# Patient Record
Sex: Female | Born: 1955 | Race: Black or African American | Hispanic: No | Smoking: Former smoker
Health system: Southern US, Community
[De-identification: ages and names within clinical notes are randomized; demographics above are authoritative.]

## PROBLEM LIST (undated history)

## (undated) DIAGNOSIS — K219 Gastro-esophageal reflux disease without esophagitis: Secondary | ICD-10-CM

## (undated) DIAGNOSIS — F329 Major depressive disorder, single episode, unspecified: Secondary | ICD-10-CM

## (undated) DIAGNOSIS — G4730 Sleep apnea, unspecified: Secondary | ICD-10-CM

## (undated) DIAGNOSIS — E039 Hypothyroidism, unspecified: Secondary | ICD-10-CM

## (undated) DIAGNOSIS — F419 Anxiety disorder, unspecified: Secondary | ICD-10-CM

## (undated) DIAGNOSIS — E785 Hyperlipidemia, unspecified: Secondary | ICD-10-CM

## (undated) DIAGNOSIS — E079 Disorder of thyroid, unspecified: Secondary | ICD-10-CM

## (undated) DIAGNOSIS — F32A Depression, unspecified: Secondary | ICD-10-CM

## (undated) DIAGNOSIS — E119 Type 2 diabetes mellitus without complications: Secondary | ICD-10-CM

## (undated) DIAGNOSIS — I10 Essential (primary) hypertension: Secondary | ICD-10-CM

## (undated) HISTORY — DX: Essential (primary) hypertension: I10

## (undated) HISTORY — DX: Anxiety disorder, unspecified: F41.9

## (undated) HISTORY — DX: Type 2 diabetes mellitus without complications: E11.9

## (undated) HISTORY — DX: Depression, unspecified: F32.A

## (undated) HISTORY — PX: THYROIDECTOMY: SHX17

## (undated) HISTORY — DX: Disorder of thyroid, unspecified: E07.9

## (undated) HISTORY — DX: Hypothyroidism, unspecified: E03.9

## (undated) HISTORY — PX: CATARACT EXTRACTION, BILATERAL: SHX1313

## (undated) HISTORY — DX: Gastro-esophageal reflux disease without esophagitis: K21.9

## (undated) HISTORY — PX: JOINT REPLACEMENT: SHX530

## (undated) HISTORY — DX: Sleep apnea, unspecified: G47.30

## (undated) HISTORY — DX: Hyperlipidemia, unspecified: E78.5

## (undated) HISTORY — DX: Major depressive disorder, single episode, unspecified: F32.9

---

## 1994-10-25 DIAGNOSIS — E119 Type 2 diabetes mellitus without complications: Secondary | ICD-10-CM

## 1994-10-25 HISTORY — DX: Type 2 diabetes mellitus without complications: E11.9

## 2004-10-25 HISTORY — PX: ABDOMINAL HYSTERECTOMY: SHX81

## 2007-10-26 HISTORY — PX: ABDOMINAL HYSTERECTOMY: SHX81

## 2015-10-26 HISTORY — PX: TOTAL KNEE ARTHROPLASTY: SHX125

## 2016-08-02 ENCOUNTER — Encounter: Payer: Self-pay | Admitting: Internal Medicine

## 2016-08-02 ENCOUNTER — Ambulatory Visit (INDEPENDENT_AMBULATORY_CARE_PROVIDER_SITE_OTHER): Payer: Self-pay | Admitting: Internal Medicine

## 2016-08-02 VITALS — BP 166/77 | Ht 66.0 in | Wt 237.2 lb

## 2016-08-02 DIAGNOSIS — E739 Lactose intolerance, unspecified: Secondary | ICD-10-CM

## 2016-08-02 DIAGNOSIS — Z79899 Other long term (current) drug therapy: Secondary | ICD-10-CM

## 2016-08-02 DIAGNOSIS — I10 Essential (primary) hypertension: Secondary | ICD-10-CM | POA: Insufficient documentation

## 2016-08-02 DIAGNOSIS — F329 Major depressive disorder, single episode, unspecified: Secondary | ICD-10-CM

## 2016-08-02 DIAGNOSIS — Z9089 Acquired absence of other organs: Secondary | ICD-10-CM

## 2016-08-02 DIAGNOSIS — Z87891 Personal history of nicotine dependence: Secondary | ICD-10-CM

## 2016-08-02 DIAGNOSIS — Z96651 Presence of right artificial knee joint: Secondary | ICD-10-CM

## 2016-08-02 DIAGNOSIS — Z888 Allergy status to other drugs, medicaments and biological substances status: Secondary | ICD-10-CM

## 2016-08-02 DIAGNOSIS — M170 Bilateral primary osteoarthritis of knee: Secondary | ICD-10-CM

## 2016-08-02 DIAGNOSIS — Z800 Family history of malignant neoplasm of digestive organs: Secondary | ICD-10-CM

## 2016-08-02 DIAGNOSIS — Z7984 Long term (current) use of oral hypoglycemic drugs: Secondary | ICD-10-CM

## 2016-08-02 DIAGNOSIS — Z90711 Acquired absence of uterus with remaining cervical stump: Secondary | ICD-10-CM

## 2016-08-02 DIAGNOSIS — F32A Depression, unspecified: Secondary | ICD-10-CM | POA: Insufficient documentation

## 2016-08-02 DIAGNOSIS — E785 Hyperlipidemia, unspecified: Secondary | ICD-10-CM

## 2016-08-02 DIAGNOSIS — Z0000 Encounter for general adult medical examination without abnormal findings: Secondary | ICD-10-CM | POA: Insufficient documentation

## 2016-08-02 DIAGNOSIS — Z803 Family history of malignant neoplasm of breast: Secondary | ICD-10-CM

## 2016-08-02 DIAGNOSIS — E890 Postprocedural hypothyroidism: Secondary | ICD-10-CM

## 2016-08-02 DIAGNOSIS — K279 Peptic ulcer, site unspecified, unspecified as acute or chronic, without hemorrhage or perforation: Secondary | ICD-10-CM

## 2016-08-02 DIAGNOSIS — E119 Type 2 diabetes mellitus without complications: Secondary | ICD-10-CM

## 2016-08-02 DIAGNOSIS — Z8711 Personal history of peptic ulcer disease: Secondary | ICD-10-CM

## 2016-08-02 DIAGNOSIS — Z23 Encounter for immunization: Secondary | ICD-10-CM

## 2016-08-02 DIAGNOSIS — M1712 Unilateral primary osteoarthritis, left knee: Secondary | ICD-10-CM

## 2016-08-02 DIAGNOSIS — Z8349 Family history of other endocrine, nutritional and metabolic diseases: Secondary | ICD-10-CM

## 2016-08-02 LAB — GLUCOSE, CAPILLARY: Glucose-Capillary: 78 mg/dL (ref 65–99)

## 2016-08-02 LAB — POCT GLYCOSYLATED HEMOGLOBIN (HGB A1C): HEMOGLOBIN A1C: 6.1

## 2016-08-02 MED ORDER — LEVOTHYROXINE SODIUM 125 MCG PO TABS: 125.0000 ug | ORAL_TABLET | Freq: Every day | ORAL | 2 refills | Status: DC

## 2016-08-02 MED ORDER — LOSARTAN POTASSIUM 100 MG PO TABS: 100.0000 mg | ORAL_TABLET | Freq: Every day | ORAL | 2 refills | Status: DC

## 2016-08-02 MED ORDER — OMEPRAZOLE 20 MG PO CPDR: 20.0000 mg | DELAYED_RELEASE_CAPSULE | Freq: Every day | ORAL | 2 refills | Status: DC

## 2016-08-02 MED ORDER — AMLODIPINE BESYLATE 10 MG PO TABS: 10.0000 mg | ORAL_TABLET | Freq: Every day | ORAL | 2 refills | Status: DC

## 2016-08-02 MED ORDER — HYDROCHLOROTHIAZIDE 25 MG PO TABS: 25.0000 mg | ORAL_TABLET | Freq: Every day | ORAL | 2 refills | Status: DC

## 2016-08-02 MED ORDER — ATENOLOL 50 MG PO TABS: 50.0000 mg | ORAL_TABLET | Freq: Every day | ORAL | 2 refills | Status: DC

## 2016-08-02 NOTE — Patient Instructions (Addendum)
It was a pleasure to meet you [name].  I will send a refill of your blood pressure, thyroid, and heartburn/stomach medication.  We are giving you the flu shot today.  Please call us if you have any concerns and follow up with us in 3 months or sooner if needed.   DASH Eating Plan DASH stands for "Dietary Approaches to Stop Hypertension." The DASH eating plan is a healthy eating plan that has been shown to reduce high blood pressure (hypertension). Additional health benefits may include reducing the risk of type 2 diabetes mellitus, heart disease, and stroke. The DASH eating plan may also help with weight loss. WHAT DO I NEED TO KNOW ABOUT THE DASH EATING PLAN? For the DASH eating plan, you will follow these general guidelines:  Choose foods with a percent daily value for sodium of less than 5% (as listed on the food label).  Use salt-free seasonings or herbs instead of table salt or sea salt.  Check with your health care provider or pharmacist before using salt substitutes.  Eat lower-sodium products, often labeled as "lower sodium" or "no salt added."  Eat fresh foods.  Eat more vegetables, fruits, and low-fat dairy products.  Choose whole grains. Look for the word "whole" as the first word in the ingredient list.  Choose fish and skinless chicken or Malawiturkey more often than red meat. Limit fish, poultry, and meat to 6 oz (170 g) each day.  Limit sweets, desserts, sugars, and sugary drinks.  Choose heart-healthy fats.  Limit cheese to 1 oz (28 g) per day.  Eat more home-cooked food and less restaurant, buffet, and fast food.  Limit fried foods.  Cook foods using methods other than frying.  Limit canned vegetables. If you do use them, rinse them well to decrease the sodium.  When eating at a restaurant, ask that your food be prepared with less salt, or no salt if possible. WHAT FOODS CAN I EAT? Seek help from a dietitian for individual calorie needs. Grains Whole  grain or whole wheat bread. Brown rice. Whole grain or whole wheat pasta. Quinoa, bulgur, and whole grain cereals. Low-sodium cereals. Corn or whole wheat flour tortillas. Whole grain cornbread. Whole grain crackers. Low-sodium crackers. Vegetables Fresh or frozen vegetables (raw, steamed, roasted, or grilled). Low-sodium or reduced-sodium tomato and vegetable juices. Low-sodium or reduced-sodium tomato sauce and paste. Low-sodium or reduced-sodium canned vegetables.  Fruits All fresh, canned (in natural juice), or frozen fruits. Meat and Other Protein Products Ground beef (85% or leaner), grass-fed beef, or beef trimmed of fat. Skinless chicken or Malaw[place]urkey. Ground chicken or [place]urkey. Pork trimmed of fat. All fish and seafood. Eggs. Dried beans, peas, or lentils. Unsalted nuts and seeds. Unsalted canned beans. Dairy Low-fat dairy products, such as skim or 1% milk, 2% or reduced-fat cheeses, low-fat ricotta or cottage cheese, or plain low-fat yogurt. Low-sodium or reduced-sodium cheeses. Fats and Oils Tub margarines without trans fats. Light or reduced-fat mayonnaise and salad dressings (reduced sodium). Avocado. Safflower, olive, or canola oils. Natural peanut or almond butter. Other Unsalted popcorn and pretzels. The items listed above may not be a complete list of recommended foods or beverages. Contact your dietitian for more options. WHAT FOODS ARE NOT RECOMMENDED? Grains White bread. White pasta. White rice. Refined cornbread. Bagels and croissants. Crackers that contain trans fat. Vegetables Creamed or fried vegetables. Vegetables in a cheese sauce. Regular canned vegetables. Regular canned tomato sauce and paste. Regular tomato and vegetable juices. Fruits Dried fruits. Canned fruit  in light or heavy syrup. Fruit juice. Meat and Other Protein Products Fatty cuts of meat. Ribs, chicken wings, bacon, sausage, bologna, salami, chitterlings, fatback, hot dogs, bratwurst, and packaged luncheon  meats. Salted nuts and seeds. Canned beans with salt. Dairy Whole or 2% milk, cream, half-and-half, and cream cheese. Whole-fat or sweetened yogurt. Full-fat cheeses or blue cheese. Nondairy creamers and whipped toppings. Processed cheese, cheese spreads, or cheese curds. Condiments Onion and garlic salt, seasoned salt, table salt, and sea salt. Canned and packaged gravies. Worcestershire sauce. Tartar sauce. Barbecue sauce. Teriyaki sauce. Soy sauce, including reduced sodium. Steak sauce. Fish sauce. Oyster sauce. Cocktail sauce. Horseradish. Ketchup and mustard. Meat flavorings and tenderizers. Bouillon cubes. Hot sauce. Tabasco sauce. Marinades. Taco seasonings. Relishes. Fats and Oils Butter, stick margarine, lard, shortening, ghee, and bacon fat. Coconut, palm kernel, or palm oils. Regular salad dressings. Other Pickles and olives. Salted popcorn and pretzels. The items listed above may not be a complete list of foods and beverages to avoid. Contact your dietitian for more information. WHERE CAN I FIND MORE INFORMATION? National Heart, Lung, and Blood Institute: travelstabloid.com   This information is not intended to replace advice given to you by your health care provider. Make sure you discuss any questions you have with your health care provider.   Document Released: 09/30/2011 Document Revised: 11/01/2014 Document Reviewed: 08/15/2013 Elsevier Interactive Patient Education [employer].

## 2016-08-02 NOTE — Assessment & Plan Note (Signed)
Patient reports a history of peptic ulcer disease. She brought photographs from a prior EGD which did show PUD. She takes Omeprazole 20 mg daily with relief. No current heartburn or reflux symptoms. She avoids NSAIDs. -Refilled Omeprazole 20 mg daily

## 2016-08-02 NOTE — Assessment & Plan Note (Signed)
Patient takes Fluoxetine 60 mg daily with good control of her depression. She denies any current SI or HI. No hallucinations. 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 -Continue fluoxetine 60 mg daily

## 2016-08-02 NOTE — Assessment & Plan Note (Addendum)
Patient currently takes HCTZ 25 mg daily, Losartan 100 mg daily, Atenolol 50 mg daily, and Amlodipine 10 mg daily. Patient's BP today is 166/77. She does state that she ran out of her Amlodipine a few days ago. She is requesting a refill of all the above medications.  BP is elevated today in the setting of missed amlodipine doses. Will continue current medications and monitor on follow up. -Continue current medications -Have sent refills for above medications -If BPs persistently elevated, can consider increasing HCTZ or Atenolol -DASH diet information provided -Continue diet/exercise -Check CMET

## 2016-08-02 NOTE — Assessment & Plan Note (Addendum)
Flu shot given today. Patient brought records indicating her last colonoscopy was in 2015 which showed a sigmoid diverticulosis with recommendation to repeat in 10 years. Last mammogram was in 2016. -Next Colonoscopy in 2025 -Address repeat mammogram on follow up

## 2016-08-02 NOTE — Assessment & Plan Note (Signed)
Reported history of OA in both knees with right knee replacement in April 2017. She uses a can to ambulate long distances, otherwise is able to get around her house to complete her ADLs without issue. No recent falls. Her pain is managed with Tylenol 1200 mg BID. She does not take NSAIDs given her history of peptic ulcer disease.  OA is well controlled with Tylenol. She is working on diet and exercise to try to reduce her weight. -Continue diet/exercise -Tylenol as needed, recommended limiting to 3000 mg daily

## 2016-08-02 NOTE — Progress Notes (Signed)
CC: HTN  HPI:  [name] is a 60 y.o. 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  Patient moved to [place] from [place] in June 2017, previously lived in [place]. She currently lives with her sister. She has brought her medication bottles with her today.  HTN: Patient currently takes HCTZ 25 mg daily, Losartan 100 mg daily, Atenolol 50 mg daily, and Amlodipine 10 mg daily. Patient's BP today is 166/77. She does state that she ran out of her Amlodipine a few days ago. She is requesting a refill of all the above medications.  T2DM: Diagnosed with diabetes in her 7340s. She states she has never taken insulin but has been on an injectable medication for her diabetes in the past. She currently takes Metformin ER 1000 mg BID. She does report some loose stool which she feels is associated with her Metformin. She does report a history of lactose intolerance and has avoided dairy products. She did bring her meter today. She checks her CBGs 1-2 times per day. She has one high value of 241, majority of values are between 90-120 with an average of 107. She denies any symptoms of highs or lows.  HLD: Currently takes moderate intensity Atorvastatin 20 mg daily.   Hypothyroidism: Patient reports a history of a goiter with subsequent total thyroidectomy. She is taking Levothyroxine 125 mcg daily.  PUD: Patient reports a history of peptic ulcer disease. She brought photographs from a prior EGD which did show PUD. She takes Omeprazole 20 mg daily with relief. No current heartburn or reflux symptoms. She avoids NSAIDs.   Osteoarthritis: Reported history of OA in both knees with right knee replacement in April 2017. She uses a can to ambulate long distances, otherwise is able to get around  her house to complete her ADLs without issue. No recent falls. Her pain is managed with Tylenol 1200 mg BID. She does not take NSAIDs given her history of peptic ulcer disease.  Depression: Patient takes Fluoxetine 60 mg daily with good control of her depression. She denies any current SI or HI. No hallucinations. 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  Health maintenance: Patient agreeable for flu shot today.  Past Surgical History: Thyroidectomy Right knee replacement April 2014 Partial hysterectomy 2009  Family Hx: Father - Colon cancer, goiter Mother - goiter Sister - triple negative breast cancer Maternal aunts - breast cancer  Allergies: She reports tongue swelling with carvedilol      Past Medical History:  Diagnosis Date  . Anxiety   . Depression   . Diabetes mellitus without complication (HCC)   . GERD (gastroesophageal reflux disease)   . Hyperlipidemia   . Hypertension   . Thyroid disease     Review of Systems:   Review of Systems  Constitutional: Positive for diaphoresis. Negative for chills and fever.  Eyes: Positive for double vision.  Respiratory: Negative for cough, hemoptysis, sputum production, shortness of breath and wheezing.   Cardiovascular: Negative for chest pain, palpitations and leg swelling.  Gastrointestinal: Positive for diarrhea. Negative for abdominal pain, blood in stool, constipation, heartburn, melena, nausea and vomiting.  Genitourinary: Negative for dysuria, frequency, hematuria and urgency.  Musculoskeletal: Positive for joint pain and neck pain. Negative for falls.  Neurological: Negative for  dizziness, tingling, seizures, loss of consciousness and headaches.  Psychiatric/Behavioral: Positive for depression. Negative for hallucinations, substance abuse and suicidal ideas.     Physical Exam:  Vitals:   08/02/16 0951  BP: (!)  166/77  SpO2: 99%  Weight: 237 lb 3.2 oz (107.6 kg)  Height: 5\' 6"  (1.676 m)   Physical Exam  Constitutional: She is oriented to person, place, and time. She appears well-developed and well-nourished. No distress.  HENT:  Head: Normocephalic and atraumatic.  Eyes: EOM are normal.  Neck: Normal range of motion. Neck supple. No tracheal deviation present.  Cardiovascular: Normal rate and regular rhythm.  Exam reveals no gallop and no friction rub.   No murmur heard. +2 DP pulses  Pulmonary/Chest: Effort normal. No respiratory distress. She has no wheezes. She has no rales.  Abdominal: Soft. Bowel sounds are normal. She exhibits no distension and no mass. There is no tenderness. There is no rebound and no guarding.  Musculoskeletal: She exhibits no edema or tenderness.  Ambulating with cane  Neurological: She is alert and oriented to person, place, and time.  Skin: Skin is warm. No rash noted. She is not diaphoretic. No erythema.  Psychiatric: She has a normal mood and affect.     Assessment & Plan:   See Encounters Tab for problem based charting.  Patient discussed with Dr. Heide Spark

## 2016-08-02 NOTE — Assessment & Plan Note (Addendum)
Diagnosed with diabetes in her 5940s. She states she has never taken insulin but has been on an injectable medication for her diabetes in the past. She currently takes Metformin ER 1000 mg BID. She does report some loose stool which she feels is associated with her Metformin. She does report a history of lactose intolerance and has avoided dairy products. She did bring her meter today. She checks her CBGs 1-2 times per day. She has one high value of 241, majority of values are between 90-120 with an average of 107. She denies any symptoms of highs or lows.  Hgb A1c today is 6.1, well controlled. If patient has continued loose stools, can consider decreasing Metformin to see if a reduced dose provides relief. -Continue Metformin 1000 mg BID -f/u 3 months for repeat Hgb a1c -Continue diet and exercise -Check CMET

## 2016-08-02 NOTE — Assessment & Plan Note (Addendum)
Patient reports a history of a goiter with subsequent total thyroidectomy. She is taking Levothyroxine 125 mcg daily. -Check TSH -Refilled Levothyroxine 125 mcg daily, adjust if TSH elevated

## 2016-08-02 NOTE — Assessment & Plan Note (Signed)
Currently takes moderate intensity Atorvastatin 20 mg daily.  Patient with HTN and T2DM. Will check Lipid Panel to calculate ASCVD score and consider increasing to high-intensity statin. -Lipid panel

## 2016-08-03 ENCOUNTER — Telehealth: Payer: Self-pay | Admitting: Internal Medicine

## 2016-08-03 LAB — LIPID PANEL
CHOLESTEROL TOTAL: 137 mg/dL (ref 100–199)
Chol/HDL Ratio: 2.4 ratio units (ref 0.0–4.4)
HDL: 58 mg/dL (ref >39)
LDL Calculated: 66 mg/dL (ref 0–99)
TRIGLYCERIDES: 67 mg/dL (ref 0–149)
VLDL Cholesterol Cal: 13 mg/dL (ref 5–40)

## 2016-08-03 LAB — CMP14 + ANION GAP
A/G RATIO: 1.5 (ref 1.2–2.2)
ALBUMIN: 4.3 g/dL (ref 3.6–4.8)
ALT: 11 IU/L (ref 0–32)
ANION GAP: 16 mmol/L (ref 10.0–18.0)
AST: 13 IU/L (ref 0–40)
Alkaline Phosphatase: 117 IU/L (ref 39–117)
BUN/Creatinine Ratio: 19 (ref 12–28)
BUN: 14 mg/dL (ref 8–27)
Bilirubin Total: 0.4 mg/dL (ref 0.0–1.2)
CALCIUM: 9.7 mg/dL (ref 8.7–10.3)
CO2: 27 mmol/L (ref 18–29)
CREATININE: 0.72 mg/dL (ref 0.57–1.00)
Chloride: 101 mmol/L (ref 96–106)
GFR, EST AFRICAN AMERICAN: 105 mL/min/{1.73_m2} (ref >59)
GFR, EST NON AFRICAN AMERICAN: 91 mL/min/{1.73_m2} (ref >59)
GLOBULIN, TOTAL: 2.8 g/dL (ref 1.5–4.5)
Glucose: 83 mg/dL (ref 65–99)
POTASSIUM: 4.6 mmol/L (ref 3.5–5.2)
SODIUM: 144 mmol/L (ref 134–144)
TOTAL PROTEIN: 7.1 g/dL (ref 6.0–8.5)

## 2016-08-03 LAB — TSH: TSH: 0.376 u[IU]/mL — ABNORMAL LOW (ref 0.450–4.500)

## 2016-08-03 NOTE — Telephone Encounter (Signed)
I am checking with [place]. To see if this person is [facility] pt

## 2016-08-03 NOTE — Telephone Encounter (Signed)
Pt states that losartan (COZAAR) 100 MG tablet cost $86 and she can not affod that would like a different medicaiton

## 2016-08-04 MED ORDER — LISINOPRIL 40 MG PO TABS: 40.0000 mg | ORAL_TABLET | Freq: Every day | ORAL | 2 refills | Status: DC

## 2016-08-04 NOTE — Telephone Encounter (Signed)
I will change the Losartan to Lisinopril 40 mg daily which is on the [facility] preferred list. Please let me know if she is unable to take this for any reason (cost, side effect). Thank you.

## 2016-08-04 NOTE — Telephone Encounter (Signed)
Agree with Dr patel. If unable to take ACEi she may be able to get it for 4 dollars from the [facility] pharmacy.

## 2016-08-04 NOTE — Progress Notes (Signed)
Internal Medicine Clinic Attending  Case discussed with Dr. Patel,Vishal at the time of the visit.  We reviewed the resident's history and exam and pertinent patient test results.  I agree with the assessment, diagnosis, and plan of care documented in the resident's note.  

## 2016-08-05 NOTE — Telephone Encounter (Signed)
Spoke w/ pt encouraged her to finish paperwork w/ debh. So she can come in for f/u

## 2016-08-06 NOTE — Telephone Encounter (Signed)
Thank you! If needed, feel free to schedule her with me for a med visit.

## 2016-09-30 ENCOUNTER — Other Ambulatory Visit: Payer: Self-pay | Admitting: *Deleted

## 2016-09-30 DIAGNOSIS — E890 Postprocedural hypothyroidism: Secondary | ICD-10-CM

## 2016-09-30 MED ORDER — LEVOTHYROXINE SODIUM 125 MCG PO TABS: 125.0000 ug | ORAL_TABLET | Freq: Every day | ORAL | 0 refills | Status: DC

## 2016-09-30 NOTE — Telephone Encounter (Signed)
TSH slightly low. Dr Allena KatzPatel planned to change dose. Pt needs appt next 30 days.  Brand change OK

## 2016-09-30 NOTE — Telephone Encounter (Signed)
Pharmacy informed that change will be ok and appt request already sent to front office.Kingsley SpittleGoldston, Karlina Suares Cassady12/7/20173:14 PM

## 2016-09-30 NOTE — Telephone Encounter (Signed)
Received fax from pt's pharmacy with the following statement "Pt been on Sandoz brand but its on backorder til January. May we change to Lannett brand til then?"  Will forward to [facility] attending for review.  Of note, I attempted to contact pt to schedule an appt and see what her plans were regarding paperwork for the financial counselor.  Home # was disconnected and no answer on the mobile #, but I was able to leave a message.  Will await call back from pt and send info to [facility] attending as pt has not been assigned a pcp.  Please advise.Kingsley SpittleGoldston, Darra Rosa Cassady12/7/20172:15 PM

## 2016-11-02 ENCOUNTER — Ambulatory Visit (INDEPENDENT_AMBULATORY_CARE_PROVIDER_SITE_OTHER): Payer: Self-pay | Admitting: Internal Medicine

## 2016-11-02 VITALS — BP 160/97 | HR 58 | Temp 98.2°F | Ht 66.0 in | Wt 251.7 lb

## 2016-11-02 DIAGNOSIS — I10 Essential (primary) hypertension: Secondary | ICD-10-CM

## 2016-11-02 DIAGNOSIS — E785 Hyperlipidemia, unspecified: Secondary | ICD-10-CM

## 2016-11-02 DIAGNOSIS — Z7984 Long term (current) use of oral hypoglycemic drugs: Secondary | ICD-10-CM

## 2016-11-02 DIAGNOSIS — Z021 Encounter for pre-employment examination: Secondary | ICD-10-CM

## 2016-11-02 DIAGNOSIS — Z0000 Encounter for general adult medical examination without abnormal findings: Secondary | ICD-10-CM

## 2016-11-02 DIAGNOSIS — E890 Postprocedural hypothyroidism: Secondary | ICD-10-CM

## 2016-11-02 DIAGNOSIS — Z79899 Other long term (current) drug therapy: Secondary | ICD-10-CM

## 2016-11-02 DIAGNOSIS — F329 Major depressive disorder, single episode, unspecified: Secondary | ICD-10-CM

## 2016-11-02 DIAGNOSIS — Z111 Encounter for screening for respiratory tuberculosis: Secondary | ICD-10-CM

## 2016-11-02 DIAGNOSIS — Z1159 Encounter for screening for other viral diseases: Secondary | ICD-10-CM

## 2016-11-02 DIAGNOSIS — Z598 Other problems related to housing and economic circumstances: Secondary | ICD-10-CM

## 2016-11-02 DIAGNOSIS — E119 Type 2 diabetes mellitus without complications: Secondary | ICD-10-CM

## 2016-11-02 DIAGNOSIS — F32A Depression, unspecified: Secondary | ICD-10-CM

## 2016-11-02 MED ORDER — OLMESARTAN-AMLODIPINE-HCTZ 40-10-25 MG PO TABS: 1.0000 | ORAL_TABLET | Freq: Every day | ORAL | 2 refills | Status: DC

## 2016-11-02 MED ORDER — METFORMIN HCL ER (MOD) 500 MG PO TB24
500.0000 mg | ORAL_TABLET | Freq: Two times a day (BID) | ORAL | 2 refills | Status: DC
Start: 2016-11-02 — End: 2016-11-03

## 2016-11-02 MED ORDER — LEVOTHYROXINE SODIUM 125 MCG PO TABS: 125.0000 ug | ORAL_TABLET | Freq: Every day | ORAL | 0 refills | Status: DC

## 2016-11-02 NOTE — Assessment & Plan Note (Signed)
Reports unable to afford Lipitor.   Lipid panel at last visit is remarkably good.   Plan monitor for now. Consider resumption of therapy once orange card in place.

## 2016-11-02 NOTE — Progress Notes (Signed)
   CC: TB skin test  HPI:  [name] is a 61 y.o. female with a past medical history listed below here today with request for TB skin test and Hepatitis screening .  She reports she is applying for a job and is required to have TB skin testing and documentation of her Hepatitis immune status. She reports having Hep B vaccination in the remote past, unsure when. Does report a history of a positive TB skin test in the remote past as well, however, notes that she has had at least two negative tests since that time.   She recently established in our clinic back in October 2017.  Also needs follow up on her chronic illnesses today.  DM 2: A1c in 07/2016 was 6.1. She is currently only on Metformin. Listed on her medication list as 1000 mg bid but she reports only taking 1500 mg total daily. She reports GI side effects, financial difficulties and general dislike for medications as the reasons for taking few pills. Denies any episodes of hypoglycemia. Does note weight gain from 227 lbs to 251 lbs today. Reports that she is stressed about her financial situation and has not been good about her diet or exercise.  HTN: BP 160/97 today. She is currently prescribed Amlodipine 10 mg daily, HCTZ 25 mg daily, Losartan 100 mg daily and Atenolol 50 mg daily. She reports she is taking the amlodipine, HCTZ and losartan. Has not been taking the Atenolol due to costs. Denies any headache, acute vision changes, chest pain, palpations, nausea/vomiting. Reports that she does not like how the medications make her feel and that she has significant difficulty getting her medications due to costs.   Past Medical History:  Diagnosis Date  . Anxiety   . Depression   . Diabetes mellitus without complication (HCC)   . GERD (gastroesophageal reflux disease)   . Hyperlipidemia   . Hypertension   . Thyroid disease     Review of Systems:   Negative except as noted in HPI  Physical Exam:  Vitals:   11/02/16 1018   BP: (!) 160/97  Pulse: (!) 58  Temp: 98.2 F (36.8 C)  TempSrc: Oral  SpO2: 100%  Weight: 251 lb 11.2 oz (114.2 kg)  Height: 5\' 6"  (1.676 m)   Physical Exam  Constitutional: She is well-developed, well-nourished, and in no distress. No distress.  HENT:  Head: Normocephalic and atraumatic.  Cardiovascular: Normal rate, regular rhythm and normal heart sounds.   Pulmonary/Chest: Effort normal and breath sounds normal. No respiratory distress. She has no wheezes. She has no rales.  Abdominal: Soft. Bowel sounds are normal. She exhibits no distension. There is no tenderness.    Assessment & Plan:   See Encounters Tab for problem based charting.  Patient discussed with Dr. Oswaldo DoneVincent

## 2016-11-02 NOTE — Patient Instructions (Signed)
Sally Wright,  It was a pleasure meeting you today.  I have [facility] your medications [facility]. I would like you to continue taking the Synthroid 125 mcg daily but I would like you to take the Metformin 500 mg twice a day and I have started you on a combination medication called Tribenzor for your blood pressure that has the hydrochlorothiazide, amlodipine and a similar drug to losartan in it. It will be once a day.  Please set up a meeting with Chauncey Readingeb Hill to work on getting the [employer].  Come back in 2 days to have your TB test read.   I would like to see you back in a month for follow up.

## 2016-11-02 NOTE — Assessment & Plan Note (Signed)
Lab Results  Component Value Date   HGBA1C 6.1 08/02/2016     Assessment: A1c in 07/2016 was 6.1. She is currently only on Metformin. Listed on her medication list as 1000 mg bid but she reports only taking 1500 mg total daily. She reports GI side effects, financial difficulties and general dislike for medications as the reasons for taking few pills. Denies any episodes of hypoglycemia. Does note weight gain from 227 lbs to 251 lbs today. Reports that she is stressed about her financial situation and has not been good about her diet or exercise.  Plan: Will decrease metformin to 500 mg bid today Encourage dietary changes and exercise Will re-check A1c at follow up visit in 1 month

## 2016-11-02 NOTE — Assessment & Plan Note (Signed)
BP Readings from Last 3 Encounters:  11/02/16 (!) 160/97  08/02/16 (!) 166/77    Lab Results  Component Value Date   NA 144 08/02/2016   K 4.6 08/02/2016   CREATININE 0.72 08/02/2016    Assessment: BP 160/97 today. She is currently prescribed Amlodipine 10 mg daily, HCTZ 25 mg daily, Losartan 100 mg daily and Atenolol 50 mg daily. She reports she is taking the amlodipine, HCTZ and losartan. Has not been taking the Atenolol due to costs. Denies any headache, acute vision changes, chest pain, palpations, nausea/vomiting. Reports that she does not like how the medications make her feel and that she has significant difficulty getting her medications due to costs.   Denies any smoking, ETOH use. Does occasionally use caffeine.   Plan: Will d/c old prescriptions and start patient on Olmesartan-Amlodipine-HCTZ 40-10-25 mg daily combination pill. Patient with questionable compliance both reporting that she does not like taking medications as well as difficulties obtaining medications. Discussed moving [facility] Rx [facility] to the [facility] to help alleviate costs as well as the combination pill. She was agreeable.   Will have patient return in a month for follow up. If patient is taking her medications and we can demonstrate compliance will consider addition of spironolactone which is also on the 4 dollar list. Could also consider secondary causes such as OSA but patient without insurance and unable to investigate further at this time.

## 2016-11-02 NOTE — Assessment & Plan Note (Signed)
Patient reports she is no longer taking the Fluoxetine for her depression due to difficulties obtaining the medication for cost concerns. She is not interested in restarting it today. PHQ9 today is 17.  Denies any SI/HI.  Plan Work on orange card and follow symptoms closely. Restart therapy when able to afford medications.

## 2016-11-03 LAB — HEPATITIS C ANTIBODY: HEP C VIRUS AB: 0.1 {s_co_ratio} (ref 0.0–0.9)

## 2016-11-03 LAB — HEPATITIS B CORE ANTIBODY, IGM: HEP B C IGM: NEGATIVE

## 2016-11-03 LAB — HEPATITIS B SURFACE ANTIBODY,QUALITATIVE: HEP B SURFACE AB, QUAL: NONREACTIVE

## 2016-11-03 LAB — HEPATITIS B SURFACE ANTIGEN: Hepatitis B Surface Ag: NEGATIVE

## 2016-11-03 MED ORDER — LEVOTHYROXINE SODIUM 125 MCG PO TABS: 125.0000 ug | ORAL_TABLET | Freq: Every day | ORAL | 0 refills | Status: DC

## 2016-11-03 MED ORDER — OLMESARTAN-AMLODIPINE-HCTZ 40-10-25 MG PO TABS: 1.0000 | ORAL_TABLET | Freq: Every day | ORAL | 2 refills | Status: DC

## 2016-11-03 MED ORDER — METFORMIN HCL ER (MOD) 500 MG PO TB24: 500.0000 mg | ORAL_TABLET | Freq: Two times a day (BID) | ORAL | 2 refills | Status: DC

## 2016-11-03 NOTE — Addendum Note (Signed)
Addended by: Hollie SalkBOSWELL, Maui Ahart L on: 11/03/2016 01:49 PM   Modules accepted: Orders

## 2016-11-03 NOTE — Progress Notes (Signed)
Internal Medicine Clinic Attending  Case discussed with Dr. Boswell at the time of the visit.  We reviewed the resident's history and exam and pertinent patient test results.  I agree with the assessment, diagnosis, and plan of care documented in the resident's note.  

## 2016-11-03 NOTE — Assessment & Plan Note (Addendum)
Patient requesting TB skin test and Hep B screening for job.  Plan TB skin test today, RTC in 2 days for reading Hep B and Hep C screening  Addendum Hep B sAg and sAb negative. I erroneously ordered core IgM instead of IgG. However, given the surface Ab and Ag being negative, indicates that patient has never been vaccinated nor been exposed. Will need Hepatitis B vaccine series.  Hep C negative.   PPD positive. Sent for CXR, pending.

## 2016-11-04 ENCOUNTER — Telehealth: Payer: Self-pay | Admitting: Student in an Organized Health Care Education/Training Program

## 2016-11-04 ENCOUNTER — Telehealth: Payer: Self-pay | Admitting: *Deleted

## 2016-11-04 DIAGNOSIS — Z227 Latent tuberculosis: Secondary | ICD-10-CM

## 2016-11-04 HISTORY — DX: Latent tuberculosis: Z22.7

## 2016-11-04 LAB — TB SKIN TEST
INDURATION: 13 mm
TB Skin Test: POSITIVE

## 2016-11-04 NOTE — Telephone Encounter (Signed)
Per Dr. Karma GreaserBoswell, rx is for regular metformin 500 mg BID & not metformin ER/Glumetza. Clarified with Darl PikesSusan (pharmacist) & stated she will [facility].

## 2016-11-04 NOTE — Telephone Encounter (Signed)
TST placed for employment was positive to 13 mm. Patient says she has had multiple positive in the past, never treated for latent or active TB. She had a brother with active TB in the 1980s that was treated. She is a diabetic on metformin. 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 We discussed the 5-10% risk of reactivated TB, she is interested in treatment. Plan for chest xray today to rule out active disease. Treatment likely will need the health department as she does not have medication coverage.

## 2016-11-15 ENCOUNTER — Encounter (HOSPITAL_COMMUNITY): Payer: Self-pay | Admitting: Radiology

## 2016-11-15 ENCOUNTER — Ambulatory Visit (HOSPITAL_COMMUNITY)
Admission: RE | Admit: 2016-11-15 | Discharge: 2016-11-15 | Disposition: A | Discharge status: Home / Self Care | Payer: Medicaid Other | Source: Ambulatory Visit | Attending: Student in an Organized Health Care Education/Training Program | Admitting: Student in an Organized Health Care Education/Training Program

## 2016-11-15 DIAGNOSIS — R7611 Nonspecific reaction to tuberculin skin test without active tuberculosis: Secondary | ICD-10-CM | POA: Insufficient documentation

## 2016-11-15 DIAGNOSIS — K8020 Calculus of gallbladder without cholecystitis without obstruction: Secondary | ICD-10-CM | POA: Insufficient documentation

## 2016-11-15 DIAGNOSIS — Z227 Latent tuberculosis: Secondary | ICD-10-CM

## 2016-11-16 ENCOUNTER — Telehealth: Payer: Self-pay | Admitting: Internal Medicine

## 2016-11-16 MED ORDER — ISONIAZID 300 MG PO TABS: 900.0000 mg | ORAL_TABLET | ORAL | 0 refills | Status: DC

## 2016-11-16 MED ORDER — RIFAPENTINE 150 MG PO TABS: 900.0000 mg | ORAL_TABLET | ORAL | 0 refills | Status: DC

## 2016-11-16 NOTE — Telephone Encounter (Addendum)
CXR done on 09/15/17 was negative for active TB. Will need treatment for latent TB now that active TB has been ruled out. Will send Rx for Isoniazid 900 mg qweekly plus rifapentine 900 mg qweekly for 12 weeks to health department.  In addition, patient has no immunity to Hep B either through vaccination or clearance and will need Hep B vaccination series.

## 2016-11-22 ENCOUNTER — Ambulatory Visit: Payer: Self-pay

## 2016-11-26 NOTE — Telephone Encounter (Signed)
Follow up call made to patient.  Pt informed that she needs hep b vaccines but she has chosen to decline them at this time.  Pt also informed that MD would like her to start isoniazid and rifapentine.  [facility]they are unable to provide this medication to her, but stated pt can get it free through the TB Control program offered through the Health Dept with a referral.  I contacted Tammy Faucett (TB intake nurse)@ [phone] to facilitate a referral.  Records will need to be faxed to [phone].  Pt will then be scheduled to come in for a visit.

## 2016-12-03 ENCOUNTER — Other Ambulatory Visit: Payer: Self-pay | Admitting: *Deleted

## 2016-12-03 DIAGNOSIS — E890 Postprocedural hypothyroidism: Secondary | ICD-10-CM

## 2016-12-05 MED ORDER — LEVOTHYROXINE SODIUM 125 MCG PO TABS: 125.0000 ug | ORAL_TABLET | Freq: Every day | ORAL | 2 refills | Status: DC

## 2016-12-07 ENCOUNTER — Encounter: Payer: Self-pay | Admitting: Internal Medicine

## 2016-12-07 ENCOUNTER — Ambulatory Visit (INDEPENDENT_AMBULATORY_CARE_PROVIDER_SITE_OTHER): Payer: Self-pay | Admitting: Internal Medicine

## 2016-12-07 VITALS — BP 138/76 | HR 82 | Temp 98.2°F | Ht 66.0 in | Wt 245.9 lb

## 2016-12-07 DIAGNOSIS — E039 Hypothyroidism, unspecified: Secondary | ICD-10-CM | POA: Insufficient documentation

## 2016-12-07 DIAGNOSIS — R7611 Nonspecific reaction to tuberculin skin test without active tuberculosis: Secondary | ICD-10-CM

## 2016-12-07 DIAGNOSIS — Z7984 Long term (current) use of oral hypoglycemic drugs: Secondary | ICD-10-CM

## 2016-12-07 DIAGNOSIS — Z201 Contact with and (suspected) exposure to tuberculosis: Secondary | ICD-10-CM

## 2016-12-07 DIAGNOSIS — Z8711 Personal history of peptic ulcer disease: Secondary | ICD-10-CM

## 2016-12-07 DIAGNOSIS — F32A Depression, unspecified: Secondary | ICD-10-CM

## 2016-12-07 DIAGNOSIS — I10 Essential (primary) hypertension: Secondary | ICD-10-CM

## 2016-12-07 DIAGNOSIS — Z227 Latent tuberculosis: Secondary | ICD-10-CM

## 2016-12-07 DIAGNOSIS — F329 Major depressive disorder, single episode, unspecified: Secondary | ICD-10-CM

## 2016-12-07 DIAGNOSIS — E890 Postprocedural hypothyroidism: Secondary | ICD-10-CM

## 2016-12-07 DIAGNOSIS — Z87891 Personal history of nicotine dependence: Secondary | ICD-10-CM

## 2016-12-07 DIAGNOSIS — E118 Type 2 diabetes mellitus with unspecified complications: Secondary | ICD-10-CM

## 2016-12-07 DIAGNOSIS — Z79899 Other long term (current) drug therapy: Secondary | ICD-10-CM

## 2016-12-07 DIAGNOSIS — K279 Peptic ulcer, site unspecified, unspecified as acute or chronic, without hemorrhage or perforation: Secondary | ICD-10-CM

## 2016-12-07 DIAGNOSIS — E119 Type 2 diabetes mellitus without complications: Secondary | ICD-10-CM

## 2016-12-07 LAB — GLUCOSE, CAPILLARY: Glucose-Capillary: 111 mg/dL — ABNORMAL HIGH (ref 65–99)

## 2016-12-07 LAB — POCT GLYCOSYLATED HEMOGLOBIN (HGB A1C): HEMOGLOBIN A1C: 6.4

## 2016-12-07 MED ORDER — FLUOXETINE HCL 20 MG PO CAPS: 60.0000 mg | ORAL_CAPSULE | Freq: Every day | ORAL | 2 refills | Status: DC

## 2016-12-07 MED ORDER — OMEPRAZOLE 40 MG PO CPDR: 40.0000 mg | DELAYED_RELEASE_CAPSULE | Freq: Every day | ORAL | 5 refills | Status: DC

## 2016-12-07 MED ORDER — PANTOPRAZOLE SODIUM 40 MG PO TBEC: 40.0000 mg | DELAYED_RELEASE_TABLET | Freq: Every day | ORAL | 1 refills | Status: DC

## 2016-12-07 NOTE — Progress Notes (Signed)
   CC: follow-up of depression, DM and latent TB  HPI:  [name] is a 61 y.o. 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 Endorses history of several positive TB tests. Endorses brother had active TB. Her CXR was without evidence for active TB. [facility] to assist with medications and treatment. Pt reports she has not heard from them yet - Given information to contact [facility] team if not called by them tomorrow. 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 Currently on 125 mcg synthroid however feels like this medicine might not be working well lately as she feels down and tired.   HTN: Initially elevated today at 160/85 however normalized with repeat. Compliant with Olmesartan-Amlodipine-HCTZ 40-10-25mg  daily.   Depression: Tearful, feeling down. Req to be back on Prozac. Was on Prozac 60 mg daily for many years however has been off oft his medicine for several months. No desire to harm herself or others. She feels current symptoms are due to being off this long term medication.   DM: Rechecking HbA1c today, was 6.1% 07/2016.  Health Maintenance: Will need f/u appt with Dr. Mila HomerJ. Wright to address remaining concerns  Past Medical History:  Diagnosis Date  . Anxiety   . Depression   . Diabetes mellitus without complication (HCC)   . GERD (gastroesophageal reflux disease)   . Hyperlipidemia   . Hypertension   . Thyroid disease     Review of Systems:  Review of Systems  Constitutional: Positive for malaise/fatigue. Negative for chills and fever.  Respiratory: Negative for cough and shortness of breath.   Cardiovascular: Negative for chest pain and leg swelling.  Gastrointestinal: Negative for abdominal pain, nausea and vomiting.  Neurological: Negative for dizziness  and headaches.  Psychiatric/Behavioral: Positive for depression. Negative for suicidal ideas. The patient is nervous/anxious and has insomnia.    Physical Exam: Physical Exam  Constitutional: She appears well-developed and well-nourished.  HENT:  Head: Normocephalic and atraumatic.  Eyes: Conjunctivae are normal. No scleral icterus.  Neck: No thyromegaly present.  Cardiovascular: Normal rate, regular rhythm and normal heart sounds.   Pulmonary/Chest: Effort normal and breath sounds normal. No respiratory distress.  Abdominal: Soft. Bowel sounds are normal. She exhibits no distension.  Skin: She is not diaphoretic.  Psychiatric: She is not agitated and not withdrawn. Thought content is not paranoid. She exhibits a depressed mood (tearfull). She expresses no suicidal ideation. She expresses no homicidal plans.   Vitals:   12/07/16 1019 12/07/16 1101  BP: (!) 160/85 138/76  Pulse: 94 82  Temp: 98.2 F (36.8 C)   TempSrc: Oral   SpO2: 97%   Weight: 245 lb 14.4 oz (111.5 kg)   Height: 5\' 6"  (1.676 m)    Assessment & Plan:   See Encounters Tab for problem based charting.  Patient discussed with Dr. Cleda DaubE. Wright

## 2016-12-07 NOTE — Patient Instructions (Addendum)
It was a pleasure meeting you today! Thank you for following up with us!  1. Today we talked about your blood pressure. It was pretty elevated today like it was at your last visit. You were started on Olmesartan-Amlodipine- HCTZ 40-10-25 mg at your last visit. Please continue taking this medication. Also, please check your blood pressures at home.  2. Today we also talked about your depression. For this I am restarting you back on Prozac. Since you have been off of this medication for awhile, lets re-start it slowly. For the first week, please take 20 mg daily. For the second week, please take 20 mg twice daily and on the third week, please take 20 mg in the morning and 40 mg in the evening. Please call the clinic or seek help immediately if you feel like hurting yourself or others.  3. Today we also talked about your diabetes. For this I am checking your HbA1c which measures your blood sugar over the past 3 months. Please continue taking Metformin 500 mg twice daily.  4. Today we also talked about your positive tuberculosis skin test. It is very important that you take the medications prescribed. Someone from the health department should be calling you, but if you do not hear from them by tomorrow I would call and schedule an appointment.  5. Please return in 1 month so we can see how you are doing.

## 2016-12-08 LAB — TSH: TSH: 1.02 u[IU]/mL (ref 0.450–4.500)

## 2016-12-10 NOTE — Assessment & Plan Note (Addendum)
History of PUD and currently on Protonix 40 mg daily which was refilled today.

## 2016-12-10 NOTE — Assessment & Plan Note (Signed)
Lab Results  Component Value Date   HGBA1C 6.4 12/07/2016   HGBA1C 6.1 08/02/2016    Assessment: Diabetes control:  At goal   Plan: Medications: Metformin 1000 mg daily and tolerating well.  Will continue current therapy. Consider increasing to max dose if patients HbA1c continues to rise.

## 2016-12-10 NOTE — Assessment & Plan Note (Signed)
Patient endorses worsening depression, feeling down, tearful, loss of interest in activites and difficulty sleeping. Overall feels drained. Believes this is related to discontinuation of her long-term antidepressant and I tend to agree. She denies any suicidal or homicidal ideations.  -Restarted Prozac 60 mg today however started slowly (20mg  x1 week --> 40 mg x 2 weeks --> 60 mg) -Pt to return in 1 month for f/u

## 2016-12-10 NOTE — Assessment & Plan Note (Signed)
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 Has been on Synthroid 125 mg however feels like this medicine might not be working lately as she is feeling more down and tired. Believe these symptoms are likely due to unmedicated depression however is time to check TSH.  -Continue Synthroid 125 mcg at this time, will adjust as needed based on results of TSH  UPDATE: TSH: 1.0, will make no changes to Synthroid at this time. Believe symptoms c/w depression

## 2016-12-10 NOTE — Progress Notes (Signed)
Internal Medicine Clinic Attending  Case discussed with Dr. Molt at the time of the visit.  We reviewed the resident's history and exam and pertinent patient test results.  I agree with the assessment, diagnosis, and plan of care documented in the resident's note. 

## 2017-01-06 ENCOUNTER — Telehealth: Payer: Self-pay

## 2017-01-06 ENCOUNTER — Ambulatory Visit (INDEPENDENT_AMBULATORY_CARE_PROVIDER_SITE_OTHER): Payer: Self-pay | Admitting: Internal Medicine

## 2017-01-06 ENCOUNTER — Ambulatory Visit (HOSPITAL_COMMUNITY)
Admission: RE | Admit: 2017-01-06 | Discharge: 2017-01-06 | Disposition: A | Discharge status: Home / Self Care | Payer: Medicaid Other | Source: Ambulatory Visit | Attending: Internal Medicine | Admitting: Internal Medicine

## 2017-01-06 DIAGNOSIS — M542 Cervicalgia: Secondary | ICD-10-CM

## 2017-01-06 DIAGNOSIS — M47812 Spondylosis without myelopathy or radiculopathy, cervical region: Secondary | ICD-10-CM | POA: Insufficient documentation

## 2017-01-06 DIAGNOSIS — M5030 Other cervical disc degeneration, unspecified cervical region: Secondary | ICD-10-CM | POA: Insufficient documentation

## 2017-01-06 MED ORDER — PREGABALIN 75 MG PO CAPS: 75.0000 mg | ORAL_CAPSULE | Freq: Three times a day (TID) | ORAL | 2 refills | Status: DC

## 2017-01-06 NOTE — Progress Notes (Signed)
   CC: Neck and arm pain   HPI:  [name] is a 61 y.o. F with pmhx outlined below here with complaint of sharp pain that starts in her neck and radiates down to her hand. The pain started about three months ago. At first the pain was intermittent but is now constant and associated with numbness and tingling in her fingers. She has been taking tylenol and tramadol with only minimal relief. She says the pain keeps her up at night and she is unable to lay on that side. She is requesting something stronger for pain.   Past Medical History:  Diagnosis Date  . Anxiety   . Depression   . Diabetes mellitus without complication (HCC)   . GERD (gastroesophageal reflux disease)   . Hyperlipidemia   . Hypertension   . Thyroid disease     Review of Systems:  All pertinents listed in HPI, otherwise negative  Physical Exam:  Vitals:   01/06/17 0921  BP: (!) 155/93  Pulse: 91  Temp: 97.9 F (36.6 C)  TempSrc: Oral  SpO2: 99%  Weight: 247 lb 12.8 oz (112.4 kg)  Height: 5\' 6"  (1.676 m)    Constitutional: NAD, appears comfortable Neck: ROM limited due to pain. No obvious deformities. Paraspinal cervical muscles tender to palpation on the left.  Cardiovascular: RRR Pulmonary/Chest: CTAB Neurological: A&Ox3, CN II - XII grossly intact.   Assessment & Plan:   See Encounters Tab for problem based charting.  Patient discussed with Dr. Josem KaufmannKlima

## 2017-01-06 NOTE — Patient Instructions (Signed)
Ms. Jean RosenthalJackson,  It was a pleasure to see you today. I am sorry to hear about your neck pain. I have sent a prescription for lyrica to your pharmacy. Please take this three times a day. I am starting you on a low dose, we can always go up on the dose if it does not help you or you find only partial relief. Please continue to take tylenol in addition to the lyrica and avoid NSAIDs (Ibuprofen, advil, naproxen) given your stomach ulcers. I have also ordered a nerve conduction study to check for nerve damage and an xray of your neck. Please go up stairs to the first floor radiology department after this appointment for your imaging. We will call you with the results. Please follow up with your PCP in 1-2 months after your nerve conduction study. If you have any questions or concerns, call our clinic at [phone] or after hours call [phone] and ask for the internal medicine resident on call. Thank you!

## 2017-01-06 NOTE — Assessment & Plan Note (Addendum)
Patient is complaining of symptoms concerning for cervical radiculopathy. She describes sharp pain that starts in her neck and radiates down her left arm with associated numbness and tingling in her fingers. She has been taking tylenol and tramadol (prescribed after a prior knee surgery) with only minimal relief. She is requesting something stronger for pain. She has a history of peptic ulcer disease (self reported) that she says was diagnosed in [place] one year ago. For this reason she is unable to take NSAIDs. 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 She has also tried muscle relaxers (flexeril) and reports they make her "ill". I explained that opioids medications are not good for treating nerve pain, and she is open to the idea of trying lyrica. If lyrica is ineffective, amitriptyline may be another options. Will plan to obtain nerve conduction studies to assess for nerve damage and cervical spine plain films to look for degenerative changes. If nerve conduction study is abnormal, patient may warrant an MRI to rule out cervical cord impingement.  -- Plain films cervical spine -- Nerve conduction study -- Lyrica 75 mg TID; uptitrate to efficacy  -- Continue tylenol -- Avoid NSAIDs given PUD   ADDENDUM: Patient unable to fill Lyrica prescription. She is uninsured and [facility]. [place] [facility] is unable to fill because it is a controlled substance. Will try amitriptyline instead as this is on the $4 [facility]. I have also reviewed the results of her cervical spine imaging which shows multilevel degenerative changes and disc space narrowing with mild neural foraminal narrowing noted at C4-5 and C5-6, but no acute abnormalities. Nerve conduction study has been ordered but not yet scheduled. Will follow up these results. Called patient and left voicemail for her to call back to  discuss.   -- Amitriptyline 25 mg QHS, uptitrate to efficacy

## 2017-01-06 NOTE — Telephone Encounter (Signed)
I will discuss with an attending and give the patient a call tomorrow. She was resistant to trying gabapentin, says it did not work for her in the past. We may try another medication. Thanks.

## 2017-01-06 NOTE — Telephone Encounter (Signed)
Requesting to speak with a nurse regarding pregabalin (LYRICA) 75 MG capsule. Please call back.

## 2017-01-06 NOTE — Progress Notes (Signed)
Case discussed with Dr. Guilloud at the time of the visit. We reviewed the resident's history and exam and pertinent patient test results. I agree with the assessment, diagnosis, and plan of care documented in the resident's note. 

## 2017-01-06 NOTE — Telephone Encounter (Signed)
Pt called and states she cannot afford lyrica, $[facility].00[facility]- it is not on the [facility] formulary, [place] [facility] cannot get it because it is controlled Gabapentin can be obtained at the health dept Please advise

## 2017-01-07 MED ORDER — AMITRIPTYLINE HCL 25 MG PO TABS: 25.0000 mg | ORAL_TABLET | Freq: Every day | ORAL | 2 refills | Status: DC

## 2017-01-07 NOTE — Addendum Note (Signed)
Addended by: Burnell BlanksGUILLOUD, Alvena Kiernan R on: 01/07/2017 09:40 AM   Modules accepted: Orders

## 2017-01-07 NOTE — Telephone Encounter (Signed)
Pt is calling back to speak with a nurse. Please call back.  

## 2017-01-12 ENCOUNTER — Telehealth: Payer: Self-pay

## 2017-01-12 NOTE — Telephone Encounter (Signed)
Question about meds. Please call back.  

## 2017-01-12 NOTE — Telephone Encounter (Signed)
States  

## 2017-01-12 NOTE — Telephone Encounter (Signed)
Thank you. Yes she needs to be seen. We will address at her Friday appointment. Thanks.

## 2017-01-12 NOTE — Telephone Encounter (Signed)
Pt calls and states the med you gave her works but it causes her to sleep too much, plus she needs something for during the day so she can function but dull the pain, if she takes what you gave her she just sleeps all the time. She states now she is having spasms in her lower back. [facility] at 1315

## 2017-01-13 ENCOUNTER — Telehealth: Payer: Self-pay | Admitting: Internal Medicine

## 2017-01-13 NOTE — Telephone Encounter (Signed)
APT. REMINDER CALL, NO ANSWER, NO VOICEMAIL °

## 2017-01-14 ENCOUNTER — Telehealth: Payer: Self-pay | Admitting: *Deleted

## 2017-01-14 ENCOUNTER — Encounter: Payer: Self-pay | Admitting: Internal Medicine

## 2017-01-14 ENCOUNTER — Ambulatory Visit (INDEPENDENT_AMBULATORY_CARE_PROVIDER_SITE_OTHER): Payer: Self-pay | Admitting: Internal Medicine

## 2017-01-14 ENCOUNTER — Telehealth: Payer: Self-pay | Admitting: Dietician

## 2017-01-14 VITALS — BP 176/76 | HR 96 | Temp 98.7°F | Ht 66.0 in | Wt 250.1 lb

## 2017-01-14 DIAGNOSIS — Z79899 Other long term (current) drug therapy: Secondary | ICD-10-CM

## 2017-01-14 DIAGNOSIS — M5412 Radiculopathy, cervical region: Secondary | ICD-10-CM

## 2017-01-14 DIAGNOSIS — E118 Type 2 diabetes mellitus with unspecified complications: Secondary | ICD-10-CM

## 2017-01-14 DIAGNOSIS — I10 Essential (primary) hypertension: Secondary | ICD-10-CM

## 2017-01-14 DIAGNOSIS — Z87891 Personal history of nicotine dependence: Secondary | ICD-10-CM

## 2017-01-14 MED ORDER — METHYLPREDNISOLONE 4 MG PO TBPK: ORAL_TABLET | ORAL | 0 refills | Status: DC

## 2017-01-14 MED ORDER — ATENOLOL 50 MG PO TABS: 50.0000 mg | ORAL_TABLET | Freq: Every day | ORAL | 2 refills | Status: DC

## 2017-01-14 MED ORDER — AMITRIPTYLINE HCL 50 MG PO TABS: 50.0000 mg | ORAL_TABLET | Freq: Every day | ORAL | 2 refills | Status: DC

## 2017-01-14 MED ORDER — CYCLOBENZAPRINE HCL 10 MG PO TABS: 10.0000 mg | ORAL_TABLET | Freq: Three times a day (TID) | ORAL | 0 refills | Status: DC | PRN

## 2017-01-14 NOTE — Telephone Encounter (Signed)
Called patient and left voicemail. [facility] instead, see note in chart. Instructed her to call back with questions.

## 2017-01-14 NOTE — Telephone Encounter (Signed)
Steroid-Induced Hyperglycemia Prevention and Management Sally Parkinsatricia Jackson is a 61 y.o. female who [facility] (diabetes patient prescribed short course of steroids).  A/P Current Regimen  Patient prescribed prednisone medrol dosepack, which she has not been abel to pick up yet. She is to call [facility] and possibly pick it up Monday. Her glucometer is broken. She requests a new one.   Prednisone indication: cervical radiculopathy  Current DM regimen metformin 1000 mg BID  Home BG Monitoring  Patient does not have a meter at home and does not check BG at home. [facility]  CBGs at home unknown  CBGs prior to steroid course 111, A1C prior to steroid course 6.4  S/Sx of hyper- or hypoglycemia: none, none  Medication Management  Asked patient to take prednisone dose in AM yes  Additional treatment for BG control is not indicated at this time.  Patient Education  Advised patient to monitor BG while on steroid therapy (at least twice daily prior to first 2 meals of the day).  Patient educated about signs/symptoms and advised to contact clinic if hyper- or hypoglycemic.  Patient did  verbalize understanding of information and regimen by repeating back topics discussed.  Follow-up call on monday to see if she started prednisone   april 6th, 2018  Norm Parcellyler, Donna 4:38 PM 01/14/2017

## 2017-01-14 NOTE — Progress Notes (Signed)
   CC: neck and arm pain  HPI:  [name] is a 61 y.o. female with past medical history outlined below here for follow-up of her neck and arm pain. She was seen in clinic one week ago. Pain description and exam were concerning for cervical radiculopathy. Plain film of her cervical spine revealed multilevel degenerative changes including osteophytic changes from C3-C7 and disc space narrowing at C4-5 and C5-6 with mild foraminal narrowing. She was started on amitriptyline 25 mg daily at bedtime instructed to take Tylenol PRN for pain (no NSAIDs due to PUD).  Nerve conduction testing was ordered due to reported weakness and frequent dropping of things at work, however this is not yet been completed.   For the details of today's visit, please refer to the assessment and plan.  Past Medical History:  Diagnosis Date  . Anxiety   . Depression   . Diabetes mellitus without complication (HCC)   . GERD (gastroesophageal reflux disease)   . Hyperlipidemia   . Hypertension   . Thyroid disease     Review of Systems:  All pertinents listed in HPI, otherwise negative  Physical Exam:  Vitals:   01/14/17 1357  BP: (!) 176/76  Pulse: 96  Temp: 98.7 F (37.1 C)  TempSrc: Oral  SpO2: 99%  Weight: 250 lb 1.6 oz (113.4 kg)  Height: 5\' 6"  (1.676 m)    Constitutional: NAD, appears comfortable Neck: ROM limited due to pain. No obvious deformities. Paraspinal cervical muscles tender to palpation on the left.  Cardiovascular: RRR Pulmonary/Chest: CTAB Neurological: A&Ox3, CN II - XII grossly intact.   Assessment & Plan:   See Encounters Tab for problem based charting.  Patient discussed with Dr. Cyndie ChimeGranfortuna

## 2017-01-14 NOTE — Patient Instructions (Addendum)
[name],  It was a pleasure to see you today. I have increased the dose of your amitriptyline to 50 mg. You may take 2 pills of your current dose until you run out and then start the new prescription. I have also given you a prescription for a steroid dose pack - please take as directed. You may take flexeril, a muscle relaxer, up to three times a day as needed for your back spasms. I have referred you to neurosurgery for an evaluation. They will call you to schedule an appointment. For your blood pressure, I have restarted your atenolol. Please take this everyday. I would like you to follow up in 2 weeks for your back pain and to have your blood pressure rechecked. If you have any questions or concerns, call our clinic at [phone] or after hours call [phone] and ask for the internal medicine resident on call. Thank you!  - Dr. Antony ContrasGuilloud      Cervical Radiculopathy Cervical radiculopathy happens when a nerve in the neck (cervical nerve) is pinched or bruised. This condition can develop because of an injury or as part of the normal aging process. Pressure on the cervical nerves can cause pain or numbness that runs from the neck all the way down into the arm and fingers. Usually, this condition gets better with rest. Treatment may be needed if the condition does not improve. What are the causes? This condition may be caused by:  Injury.  Slipped (herniated) disk.  Muscle tightness in the neck because of overuse.  Arthritis.  Breakdown or degeneration in the bones and joints of the spine (spondylosis) due to aging.  Bone spurs that may develop near the cervical nerves. What are the signs or symptoms? Symptoms of this condition include:  Pain that runs from the neck to the arm and hand. The pain can be severe or irritating. It may be worse when the neck is moved.  Numbness or weakness in the affected arm and hand. How is this diagnosed? This condition may be diagnosed based on  symptoms, medical history, and a physical exam. You may also have tests, including:  X-rays.  CT scan.  MRI.  Electromyogram (EMG).  Nerve conduction tests. How is this treated? In many cases, treatment is not needed for this condition. With rest, the condition usually gets better over time. If treatment is needed, options may include:  Wearing a soft neck collar for short periods of time.  Physical therapy to strengthen your neck muscles.  Medicines, such as NSAIDs, oral corticosteroids, or spinal injections.  Surgery. This may be needed if other treatments do not help. Various types of surgery may be done depending on the cause of your problems. Follow these instructions at home: Managing pain   Take over-the-counter and prescription medicines only as told by your health care provider.  If directed, apply ice to the affected area.  Put ice in a plastic bag.  Place a towel between your skin and the bag.  Leave the ice on for 20 minutes, 2-3 times per day.  If ice does not help, you can try using heat. Take a warm shower or warm bath, or use a heat pack as told by your health care provider.  Try a gentle neck and shoulder massage to help relieve symptoms. Activity   Rest as needed. Follow instructions from your health care provider about any restrictions on activities.  Do stretching and strengthening exercises as told by your health care provider or physical  therapist. General instructions   If you were given a soft collar, wear it as told by your health care provider.  Use a flat pillow when you sleep.  Keep all follow-up visits as told by your health care provider. This is important. Contact a health care provider if:  Your condition does not improve with treatment. Get help right away if:  Your pain gets much worse and cannot be controlled with medicines.  You have weakness or numbness in your hand, arm, face, or leg.  You have a high fever.  You have a  stiff, rigid neck.  You lose control of your bowels or your bladder (have incontinence).  You have trouble with walking, balance, or speaking. This information is not intended to replace advice given to you by your health care provider. Make sure you discuss any questions you have with your health care provider. Document Released: 07/06/2001 Document Revised: 03/18/2016 Document Reviewed: 12/05/2014 Elsevier Interactive Patient Education  2017 [employer].

## 2017-01-14 NOTE — Assessment & Plan Note (Addendum)
Patient has symptoms of sharp neck pain that radiates down her left shoulder and arm x 3 months. Pain is associated with weakness and paresthesias of her left upper extremity. Recent plain film of her cervical spine revealed multilevel degenerative changes including osteophytic changes from C3-7 and disc space narrowing at C4-5 and C5-6 with mild foraminal narrowing. She was recently started on amitriptyline 25 mg daily at bedtime. She reports symptomatic relief with this medication and improvement in her sleep, however analgesic effect wears off around noon the following day. Today we discussed increasing the dose of this medication for better pain control. We will also try Flexeril 3 times a day as needed and a Medrol Dosepak given her ongoing severe symptoms. Nerve conduction studies were ordered at her last visit and still pending. We will order an MRI of her cervical spine for further evaluation. Refer to neurosurgery for evaluation. -- Increased amitriptyline to 50 mg QHS -- Flexeril TID prn  -- Tylenol prn for pain (no NSAIDs due to PUD) -- Medrol Dose pack  -- MRI cervical spine -- Refer to neurosurgery   [facility] amitriptyline or Medrol Dose pack. [facility]. Amitriptyline is on the $4 list. Unsure cost of Medrol Dose pack. Called patient and left voicemail. Told her if the Medrol Dose pack is too expensive to not worry about picking up this medication. We can try prednisone instead. Instructed to call back with any questions.

## 2017-01-14 NOTE — Telephone Encounter (Signed)
Pt had called front office- stated medication could not be filled at the pharmacy. But was disconnected before knowing the details. I called pt - no answer; left message to call back.

## 2017-01-14 NOTE — Addendum Note (Signed)
Addended by: Burnell BlanksGUILLOUD, Kruz Chiu R on: 01/14/2017 04:08 PM   Modules accepted: Orders

## 2017-01-14 NOTE — Progress Notes (Signed)
Medicine attending: Medical history, presenting problems, physical findings, and medications, reviewed with resident physician Dr Reymundo Pollarolyn Guilloud on the day of the patient visit and I concur with her evaluation and management plan. Pt here for further eval of cervical radiculopathy. We will try short course of steroids. Schedule MRI. Neurosurg referral. Second problem: poorly controlled HTN already on triple therapy.Reaction to carvedilol in past but per pt, has tolerated Atenolol which we will add back to her regimen. Short term follow up visit.

## 2017-01-14 NOTE — Assessment & Plan Note (Signed)
Uncontrolled, BP 176/76 today and persistently elevated on recheck 158/78. Patient reports compliance with her home olmesartan-amlodipine-hctz. Patient reports an allergy to carveilol (tongue swelling) but was previously on atenolol for many years without issue. This medicine was stopped due loss of insurance coverage. She is now approved for the orange card.  -- Continue olmesartan-amlodipine-hctz 40-10-25 mg daily  -- Start Atenolol 50 mg daily  -- Follow up 2 weeks

## 2017-01-14 NOTE — Telephone Encounter (Signed)
[facility] pharmacy called/stated they do not have Amitriptyline and Medrol Dosepak. They do have the two other meds prescribed but pt needs to bring her orange card.

## 2017-01-17 ENCOUNTER — Telehealth: Payer: Self-pay | Admitting: Pharmacist

## 2017-01-17 MED ORDER — GLUCOSE BLOOD VI STRP: ORAL_STRIP | 12 refills | Status: DC

## 2017-01-17 MED ORDER — LANCETS 30G MISC: 5 refills | Status: DC

## 2017-01-17 MED ORDER — BLOOD GLUCOSE MONITOR KIT: PACK | 0 refills | Status: DC

## 2017-01-17 NOTE — Telephone Encounter (Signed)
Pt has spoken in other encounters with someone

## 2017-01-17 NOTE — Telephone Encounter (Signed)
Tried calling patient today, unable to reach

## 2017-01-18 NOTE — Progress Notes (Signed)
Tried contacting patient for DM + steroid monitoring, unable to reach

## 2017-01-28 ENCOUNTER — Ambulatory Visit: Payer: Medicaid Other

## 2017-01-28 ENCOUNTER — Ambulatory Visit (HOSPITAL_COMMUNITY)
Admission: RE | Admit: 2017-01-28 | Discharge: 2017-01-28 | Disposition: A | Discharge status: Home / Self Care | Payer: Self-pay | Source: Ambulatory Visit | Attending: Oncology | Admitting: Oncology

## 2017-01-28 ENCOUNTER — Ambulatory Visit (INDEPENDENT_AMBULATORY_CARE_PROVIDER_SITE_OTHER): Payer: Self-pay | Admitting: Internal Medicine

## 2017-01-28 VITALS — BP 152/75 | HR 64 | Temp 98.9°F | Wt 253.6 lb

## 2017-01-28 DIAGNOSIS — K277 Chronic peptic ulcer, site unspecified, without hemorrhage or perforation: Secondary | ICD-10-CM

## 2017-01-28 DIAGNOSIS — M50122 Cervical disc disorder at C5-C6 level with radiculopathy: Secondary | ICD-10-CM | POA: Insufficient documentation

## 2017-01-28 DIAGNOSIS — Z8711 Personal history of peptic ulcer disease: Secondary | ICD-10-CM

## 2017-01-28 DIAGNOSIS — M5412 Radiculopathy, cervical region: Secondary | ICD-10-CM

## 2017-01-28 DIAGNOSIS — M2578 Osteophyte, vertebrae: Secondary | ICD-10-CM | POA: Insufficient documentation

## 2017-01-28 DIAGNOSIS — M4722 Other spondylosis with radiculopathy, cervical region: Secondary | ICD-10-CM | POA: Insufficient documentation

## 2017-01-28 DIAGNOSIS — Z79899 Other long term (current) drug therapy: Secondary | ICD-10-CM

## 2017-01-28 DIAGNOSIS — I10 Essential (primary) hypertension: Secondary | ICD-10-CM

## 2017-01-28 DIAGNOSIS — Z87891 Personal history of nicotine dependence: Secondary | ICD-10-CM

## 2017-01-28 MED ORDER — ATENOLOL 50 MG PO TABS: 50.0000 mg | ORAL_TABLET | Freq: Every day | ORAL | 2 refills | Status: DC

## 2017-01-28 MED ORDER — OLMESARTAN-AMLODIPINE-HCTZ 40-10-25 MG PO TABS: 1.0000 | ORAL_TABLET | Freq: Every day | ORAL | 2 refills | Status: DC

## 2017-01-28 MED ORDER — SPIRONOLACTONE 25 MG PO TABS: 25.0000 mg | ORAL_TABLET | Freq: Every day | ORAL | 1 refills | Status: DC

## 2017-01-28 MED ORDER — PANTOPRAZOLE SODIUM 40 MG PO TBEC: 40.0000 mg | DELAYED_RELEASE_TABLET | Freq: Every day | ORAL | 1 refills | Status: DC

## 2017-01-28 MED ORDER — AMITRIPTYLINE HCL 100 MG PO TABS: 100.0000 mg | ORAL_TABLET | Freq: Every day | ORAL | 1 refills | Status: DC

## 2017-01-28 NOTE — Progress Notes (Signed)
   CC: HTN follow up  HPI:  [name] is a 61 y.o. female with a past medical history listed below here today for follow up of her HTN.  For details of today's visit and the status of her chronic medical issues please refer to the assessment and plan.  Past Medical History:  Diagnosis Date  . Anxiety   . Depression   . Diabetes mellitus without complication (HCC)   . GERD (gastroesophageal reflux disease)   . Hyperlipidemia   . Hypertension   . Thyroid disease     Review of Systems:   See HPI  Physical Exam:  Vitals:   01/28/17 1438 01/28/17 1439  BP: (!) 166/76 (!) 152/75  Pulse: 69 64  Temp: 98.9 F (37.2 C)   TempSrc: Oral   SpO2: 98%   Weight: 253 lb 9.6 oz (115 kg)    Physical Exam  Constitutional: She is well-developed, well-nourished, and in no distress. No distress.  Cardiovascular: Normal rate and regular rhythm.   Pulmonary/Chest: Effort normal and breath sounds normal.  Neurological: She has normal sensation, normal strength and normal reflexes.   Assessment & Plan:   See Encounters Tab for problem based charting.  Patient discussed with Dr. Cleda Daub

## 2017-01-28 NOTE — Assessment & Plan Note (Signed)
Refilled patients Protonix today

## 2017-01-28 NOTE — Patient Instructions (Addendum)
[name],  Your MRI results today came back with nothing concerning. You have degenerative disc disease in your cervical spine. Continue to take the amitriptyline at night, you can increase the dose to 100 mg if needed. Losing weight will be important to help control your back pain as well. I would recommend physical therapy as well.    Your blood pressure is elevated today. I am going to add another medication to your regiment today called spironolactone. Please take it once a day. Losing weight will also help with your blood pressure. Try to limit your salt intake to 2 g a day.   Cooking With Less Salt Cooking with less salt is one way to reduce the amount of sodium you get from food. Depending on your condition and overall health, your health care provider or diet and nutrition specialist (dietitian) may recommend that you reduce your sodium intake. Most people should have less than 2,300 milligrams (mg) of sodium each day. If you have high blood pressure (hypertension), you may need to limit your sodium to 1,500 mg each day. Follow the tips below to help reduce your sodium intake. What do I need to know about cooking with less salt? Shopping   Buy sodium-free or low-sodium products. Look for the following words on food labels:  Low-sodium.  Sodium-free.  Reduced-sodium.  No salt added.  Unsalted.  Buy fresh or frozen vegetables. Avoid canned vegetables.  Avoid buying meats or protein foods that have been injected with broth or saline solution.  Avoid cured or smoked meats, such as hot dogs, bacon, salami, ham, and bologna. Reading food labels   Check the food label before buying or using packaged ingredients.  Look for products with no more than 140 mg of sodium in one serving.  Do not choose foods with salt as one of the first three ingredients on the ingredients list. If salt is one of the first three ingredients, it usually means the item is high in sodium, because  ingredients are listed in order of amount in the food item. Cooking   Use herbs, seasonings without salt, and spices as substitutes for salt in foods.  Use sodium-free baking soda when baking.  Grill, braise, or roast foods to add flavor with less salt.  Avoid adding salt to pasta, rice, or hot cereals while cooking.  Drain and rinse canned vegetables before use.  Avoid adding salt when cooking sweets and desserts.  Cook with low-sodium ingredients. What are some salt alternatives? The following are herbs, seasonings, and spices that can be used instead of salt to give taste to your food. Herbs should be fresh or dried. Do not choose packaged mixes. Next to the name of the herb, spice, or seasoning are some examples of foods you can pair it with. Herbs   Bay leaves - Soups, meat and vegetable dishes, and spaghetti sauce.  Basil - [employer], soups, pasta, and fish dishes.  Cilantro - Meat, poultry, and vegetable dishes.  Chili powder - Marinades and Mexican dishes.  Chives - Salad dressings and potato dishes.  Cumin - Mexican dishes, couscous, and meat dishes.  Dill - Fish dishes, sauces, and salads.  Fennel - Meat and vegetable dishes, breads, and cookies.  Garlic (do not use garlic salt) - [place] dishes, meat dishes, salad dressings, and sauces.  Marjoram - Soups, potato dishes, and meat dishes.  Oregano - Pizza and spaghetti sauce.  Parsley - Salads, soups, pasta, and meat dishes.  Rosemary - [employer],  salad dressings, soups, and red meats.  Saffron - Fish dishes, pasta, and some poultry dishes.  Sage - Stuffings and sauces.  Tarragon - Fish and Whole Foods.  Thyme - Stuffing, meat, and fish dishes. Seasonings   Lemon juice - Fish dishes, poultry dishes, vegetables, and salads.  Vinegar - Salad dressings, vegetables, and fish dishes. Spices   Cinnamon - Sweet dishes, such as cakes, cookies, and puddings.  Cloves - Gingerbread, puddings,  and marinades for meats.  Curry - Vegetable dishes, fish and poultry dishes, and stir-fry dishes.  Ginger - Vegetables dishes, fish dishes, and stir-fry dishes.  Nutmeg - Pasta, vegetables, poultry, fish dishes, and custard. What are some low-sodium ingredients and foods?  Fresh or frozen fruits and vegetables with no sauce added.  Fresh or frozen whole meats, poultry, and fish with no sauce added.  Eggs.  Noodles, pasta, quinoa, rice.  Shredded or puffed wheat or puffed rice.  Regular or quick oats.  Milk, yogurt, hard cheeses, and low-sodium cheeses. Good cheese choices include Swiss, [employer], and [address]. Always check the label for the serving size and sodium content.  Unsalted butter or margarine.  Unsalted nuts.  Sherbet or ice cream (keep to  cup per serving).  Homemade pudding.  Sodium-free baking soda and baking powder. This is not a complete list of low-sodium ingredients and foods. Contact your dietitian for more options.  Summary  Cooking with less salt is one way to reduce the amount of sodium that you get from food.  Buy sodium-free or low-sodium products.  Check the food label before using or buying packaged ingredients.  Use herbs, seasonings without salt, and spices as substitutes for salt in foods. This information is not intended to replace advice given to you by your health care provider. Make sure you discuss any questions you have with your health care provider. Document Released: 10/11/2005 Document Revised: 10/19/2016 Document Reviewed: 10/19/2016 Elsevier Interactive Patient Education  2017 [employer].

## 2017-01-28 NOTE — Assessment & Plan Note (Addendum)
[name] had her C-spine MRI done today which showed degenerative disc disease present at C4-5 and C5-6 without neural impingement. 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 Otherwise no abnormalities to explain her symptoms. She reports that she was unable to get the flexeril. Did get the Medrol dose pack which helped with her symptoms though she felt that she was too energetic and work up while on it. The amitriptyline 50 mg qhs does help with her night time symptoms but still having symptoms during the day. Only taking Tylenol during the day.   Assessment: Cervical radiculopathy  Plan: Will increase amitriptyline up to 100 mg qhs  Discussed weight loss at length Will refer for outpatient PT but may be cost prohibitive Continue Tyelnol prn

## 2017-01-28 NOTE — Assessment & Plan Note (Addendum)
BP Readings from Last 3 Encounters:  01/28/17 (!) 152/75  01/14/17 (!) 176/76  01/06/17 (!) 155/93    Lab Results  Component Value Date   NA 144 08/02/2016   K 4.6 08/02/2016   CREATININE 0.72 08/02/2016   [name] returns today for recheck of her blood pressure. She was seen 3/23 and BP elevated to 176/76 at that time and remained elevated at 158/78 on re-check. She has been on olmesartan-amlodipine-hctz 40-10-25 mg daily and atenolol 50 mg daily was added to her regimen (she had been on previously but stopped 2/2 cost).   Today, her BP remains elevated at 166/76 on initial check. HR is 69 (96 at last visit) and she reports compliance with the atenolol. Improved to 152/76 on re-check.  Checks sporadically at home and SBP ranges 150-170s. Reports occasionally occipital headaches, no vision changes, chest pain, shortness of breath, lightheadedness/dizziness.    Assessment: Resistant HTN  Plan: Will add spironolactone 25 mg daily to her regimen today. RTC in 2 weeks for follow up and BMET. Additional work up of secondary causes limited by lack of insurance.

## 2017-02-01 NOTE — Progress Notes (Signed)
Internal Medicine Clinic Attending  Case discussed with Dr. Boswell at the time of the visit.  We reviewed the resident's history and exam and pertinent patient test results.  I agree with the assessment, diagnosis, and plan of care documented in the resident's note.  

## 2017-02-02 ENCOUNTER — Telehealth: Payer: Self-pay | Admitting: Internal Medicine

## 2017-02-02 ENCOUNTER — Encounter: Payer: Self-pay | Admitting: Internal Medicine

## 2017-02-02 DIAGNOSIS — I10 Essential (primary) hypertension: Secondary | ICD-10-CM

## 2017-02-02 DIAGNOSIS — I1A0 Resistant hypertension: Secondary | ICD-10-CM

## 2017-02-02 MED ORDER — OLMESARTAN-AMLODIPINE-HCTZ 40-10-25 MG PO TABS: 1.0000 | ORAL_TABLET | Freq: Every day | ORAL | 2 refills | Status: DC

## 2017-02-02 NOTE — Telephone Encounter (Signed)
Rx sent to the [facility]

## 2017-02-02 NOTE — Telephone Encounter (Signed)
Olmesartan-Amlodipine-HCTZ 40-10-25 MG TABS Med not at pharmacy  [facility] department

## 2017-02-02 NOTE — Telephone Encounter (Signed)
Talked to [facility] at [facility] - stated pt was informed she needs to apply for the MAP program. And they do not have Olmesartan-Amlodipine-HCTZ 40-10-25 MG ;substitute would be Edarbyclor (they have no samples) along with Amlodipine. But first she needs to apply for MAP.  Talked to pt - informed of the above. Requesting Olmesartan-Amlodipine-HCTZ be sent to [facility] ; "IM Program" on rx. Thanks

## 2017-02-04 ENCOUNTER — Encounter: Payer: Self-pay | Admitting: Internal Medicine

## 2017-02-07 NOTE — Addendum Note (Signed)
Addended by: Neomia Dear on: 02/07/2017 07:02 PM   Modules accepted: Orders

## 2017-02-17 ENCOUNTER — Ambulatory Visit: Payer: Medicaid Other

## 2017-02-24 ENCOUNTER — Other Ambulatory Visit: Payer: Self-pay | Admitting: Internal Medicine

## 2017-02-24 DIAGNOSIS — E890 Postprocedural hypothyroidism: Secondary | ICD-10-CM

## 2017-03-02 ENCOUNTER — Other Ambulatory Visit: Payer: Self-pay | Admitting: *Deleted

## 2017-03-02 DIAGNOSIS — M5412 Radiculopathy, cervical region: Secondary | ICD-10-CM

## 2017-03-03 ENCOUNTER — Encounter: Payer: Medicaid Other | Admitting: Internal Medicine

## 2017-03-03 MED ORDER — CYCLOBENZAPRINE HCL 10 MG PO TABS: 10.0000 mg | ORAL_TABLET | Freq: Three times a day (TID) | ORAL | 2 refills | Status: DC | PRN

## 2017-03-15 ENCOUNTER — Ambulatory Visit: Payer: Medicaid Other

## 2017-03-15 ENCOUNTER — Ambulatory Visit (INDEPENDENT_AMBULATORY_CARE_PROVIDER_SITE_OTHER): Payer: Self-pay | Admitting: Internal Medicine

## 2017-03-15 VITALS — BP 172/85 | HR 57 | Temp 98.3°F | Ht 66.0 in | Wt 253.0 lb

## 2017-03-15 DIAGNOSIS — R7611 Nonspecific reaction to tuberculin skin test without active tuberculosis: Secondary | ICD-10-CM

## 2017-03-15 DIAGNOSIS — I10 Essential (primary) hypertension: Secondary | ICD-10-CM

## 2017-03-15 DIAGNOSIS — Z79899 Other long term (current) drug therapy: Secondary | ICD-10-CM

## 2017-03-15 DIAGNOSIS — M47812 Spondylosis without myelopathy or radiculopathy, cervical region: Secondary | ICD-10-CM

## 2017-03-15 DIAGNOSIS — E890 Postprocedural hypothyroidism: Secondary | ICD-10-CM

## 2017-03-15 DIAGNOSIS — M4692 Unspecified inflammatory spondylopathy, cervical region: Secondary | ICD-10-CM

## 2017-03-15 DIAGNOSIS — Z227 Latent tuberculosis: Secondary | ICD-10-CM

## 2017-03-15 DIAGNOSIS — E785 Hyperlipidemia, unspecified: Secondary | ICD-10-CM

## 2017-03-15 DIAGNOSIS — Z87891 Personal history of nicotine dependence: Secondary | ICD-10-CM

## 2017-03-15 DIAGNOSIS — H538 Other visual disturbances: Secondary | ICD-10-CM

## 2017-03-15 MED ORDER — LEVOTHYROXINE SODIUM 125 MCG PO TABS: 125.0000 ug | ORAL_TABLET | Freq: Every day | ORAL | 1 refills | Status: DC

## 2017-03-15 MED ORDER — AMITRIPTYLINE HCL 100 MG PO TABS: 100.0000 mg | ORAL_TABLET | Freq: Every day | ORAL | 1 refills | Status: DC

## 2017-03-15 MED ORDER — MELOXICAM 7.5 MG PO TABS: 7.5000 mg | ORAL_TABLET | Freq: Every day | ORAL | 0 refills | Status: DC

## 2017-03-15 MED ORDER — ATORVASTATIN CALCIUM 20 MG PO TABS: 20.0000 mg | ORAL_TABLET | Freq: Every day | ORAL | Status: DC

## 2017-03-15 NOTE — Patient Instructions (Addendum)
We will send for the eye doctor's referral. If you do not hear back in 1 week, please call back our office.  For the neck pain, please try meloxicam with your amitriptyline.  Please follow-up in July with Dr. Mikey BussingHoffman, and we look forward to working with you in our clinic.

## 2017-03-15 NOTE — Progress Notes (Signed)
   CC: neck and shoulder pain  HPI:  [name] is a 61 y.o. who presents today for neck and shoulder pain. Please see assessment & plan for status of chronic medical problems.   Past Medical History:  Diagnosis Date  . Anxiety   . Depression   . Diabetes mellitus without complication (HCC)   . GERD (gastroesophageal reflux disease)   . Hyperlipidemia   . Hypertension   . Thyroid disease     Review of Systems:  Please see each problem below for a pertinent review of systems.  Physical Exam:  Vitals:   03/15/17 1418 03/15/17 1525  BP: (!) 184/95 (!) 172/85  Pulse: 62 (!) 57  Temp: 98.3 F (36.8 C)   TempSrc: Oral   SpO2: 99%   Weight: 253 lb (114.8 kg)   Height: 5\' 6"  (1.676 m)    Physical Exam  Constitutional: She is oriented to person, place, and time. No distress.  HENT:  Head: Normocephalic and atraumatic.  Eyes: Conjunctivae are normal. No scleral icterus.  Cardiovascular: Normal rate and regular rhythm.   Pulmonary/Chest: Effort normal. No respiratory distress.  Neurological: She is alert and oriented to person, place, and time.  Skin: Skin is warm and dry. She is not diaphoretic.    Assessment & Plan:   See Encounters Tab for problem based charting.  Patient discussed with Dr. Criselda PeachesMullen

## 2017-03-16 ENCOUNTER — Encounter: Payer: Self-pay | Admitting: Internal Medicine

## 2017-03-16 DIAGNOSIS — H538 Other visual disturbances: Secondary | ICD-10-CM | POA: Insufficient documentation

## 2017-03-16 NOTE — Assessment & Plan Note (Signed)
Assessment In reconciling her medications, she reports never being on isoniazid and rifapentine. The only bottle she has with her today is rifampin 600 mg daily. She receives these medications from the health department.  Plan -Adjusted her medication list accordingly

## 2017-03-16 NOTE — Assessment & Plan Note (Signed)
Assessment She would like to refill cyclobenzaprine for her neck and shoulder pain. Upon exploring her symptoms, she experiences a lot of daytime sedation since starting this medication which limits her ability to keep herself busy. For sleep, she finds amitriptyline 100 mg at bedtime and felt the cyclobenzaprine also helped at night. She is leery of NSAIDs given an ulcer she had back in high school but takes acetaminophen 600 mg x 4/day with some relief in her pain.   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    Plan -Advised against any muscle relaxant given that she already gets sleep benefit with amitriptyline. Her progress with this medication is well documented in the chart and matches with her report of symptoms. -Prescribed meloxicam 7.5 mg as she has taken it in the past without issues, preferably GI protective as compared to the other NSAIDs, and [facility]  -Recommended she continue PPI therapy 30 minutes before meals

## 2017-03-16 NOTE — Assessment & Plan Note (Signed)
Assessment She needs a refill of levothyroxine 125 mcg.  Plan -[facility] given $[facility] dollar list

## 2017-03-16 NOTE — Assessment & Plan Note (Addendum)
Assessment She is on atorvastatin 20 mg and was told she needed to take this medication given her risk factors. Per ASCVD Risk Estimator Plus calculator, 10-year risk is 11.5% and 3.4% with optimal risk factors. High intensity statin therapy is thus indicated, but I will defer increasing her current dose as she is not interested in making multiple medication changes today.   Plan -Advised she continue taking atorvastatin 20 mg.  -Increase to atorvastatin 40 mg at follow-up once we have confirmed her ability to receive medications

## 2017-03-16 NOTE — Assessment & Plan Note (Signed)
Assessment Her blood pressure is elevated at 184/95 and 172/85 on recheck. She takes olmesartan/amlodipine/HCTZ 40/10/25 mg pill and atenolol 50 mg daily but questions the efficacy of the former. She was on both agents at her last office visit when her BP was only 152/75 and was to start spironolactone though the pharmacy never received the prescription. She complains of headaches and blurry vision though does not feel they are associated with her BP. She would like for me to divide up her combination pill today and is not interested in starting any new medications.  Plan -Consulted Dr. Selena BattenKim to assess best option for medication. She recommended [facility] and will work on transferring her prescriptions. She will follow-up in 1 week after she faxes over the paperwork. -Provided her with funds from the clinic medication fund.  -Continue current regimen. She agrees to refill through outpatient pharmacy

## 2017-03-16 NOTE — Progress Notes (Signed)
Internal Medicine Clinic Attending  Case discussed with Dr. Patel,Rushil at the time of the visit.  We reviewed the resident's history and exam and pertinent patient test results.  I agree with the assessment, diagnosis, and plan of care documented in the resident's note.  

## 2017-03-16 NOTE — Assessment & Plan Note (Signed)
Assessment She previously followed an eye doctor for blurry vision and would like a referral today. Her vision deficit is serious enough that she avoids driving as a result.  Plan -Refer to ophthalmology.  -Consider [facility] referral as it costs her $40 for roundtrip Uber fare as she lives in a location without access to public transportation.

## 2017-03-17 ENCOUNTER — Telehealth: Payer: Self-pay | Admitting: Internal Medicine

## 2017-03-17 NOTE — Telephone Encounter (Signed)
PATIENT WANTED TRANSPORTATION WITH THE ORANGE CARD, ADVISED PATIENT I COULD FILL OUT FORM AND THEY WOULD GIVE HER THE NECESSARY BUS PASSES MONTHLY FOR DOCTOR VISITS, SHE WAS NOT INTERESTED IN THAT ASSISTANCE BECAUSE SHE LIVES WHERE THERE IS NO BUS ROUTE. THAT IS THE ONLY HELP WE CAN OFFER WITH  [facility] CARD

## 2017-03-17 NOTE — Telephone Encounter (Signed)
I called pt to find out about her email and the attachment that we did not get, one number was not in service and the other I left a message for a rtc.

## 2017-04-28 ENCOUNTER — Ambulatory Visit (INDEPENDENT_AMBULATORY_CARE_PROVIDER_SITE_OTHER): Payer: Self-pay | Admitting: Internal Medicine

## 2017-04-28 ENCOUNTER — Other Ambulatory Visit (HOSPITAL_COMMUNITY)
Admission: RE | Admit: 2017-04-28 | Discharge: 2017-04-28 | Disposition: A | Discharge status: Home / Self Care | Payer: Self-pay | Source: Ambulatory Visit | Attending: Student in an Organized Health Care Education/Training Program | Admitting: Student in an Organized Health Care Education/Training Program

## 2017-04-28 ENCOUNTER — Encounter: Payer: Self-pay | Admitting: Dietician

## 2017-04-28 ENCOUNTER — Ambulatory Visit: Payer: Self-pay | Admitting: Pharmacist

## 2017-04-28 VITALS — BP 171/108 | HR 74 | Temp 98.2°F | Wt 253.1 lb

## 2017-04-28 DIAGNOSIS — Z79899 Other long term (current) drug therapy: Secondary | ICD-10-CM

## 2017-04-28 DIAGNOSIS — Z6841 Body Mass Index (BMI) 40.0 and over, adult: Secondary | ICD-10-CM

## 2017-04-28 DIAGNOSIS — E890 Postprocedural hypothyroidism: Secondary | ICD-10-CM

## 2017-04-28 DIAGNOSIS — E118 Type 2 diabetes mellitus with unspecified complications: Secondary | ICD-10-CM

## 2017-04-28 DIAGNOSIS — I10 Essential (primary) hypertension: Secondary | ICD-10-CM

## 2017-04-28 DIAGNOSIS — E119 Type 2 diabetes mellitus without complications: Secondary | ICD-10-CM

## 2017-04-28 DIAGNOSIS — Z87891 Personal history of nicotine dependence: Secondary | ICD-10-CM

## 2017-04-28 DIAGNOSIS — F329 Major depressive disorder, single episode, unspecified: Secondary | ICD-10-CM

## 2017-04-28 DIAGNOSIS — Z7984 Long term (current) use of oral hypoglycemic drugs: Secondary | ICD-10-CM

## 2017-04-28 DIAGNOSIS — N898 Other specified noninflammatory disorders of vagina: Secondary | ICD-10-CM

## 2017-04-28 DIAGNOSIS — E669 Obesity, unspecified: Secondary | ICD-10-CM

## 2017-04-28 DIAGNOSIS — M47812 Spondylosis without myelopathy or radiculopathy, cervical region: Secondary | ICD-10-CM

## 2017-04-28 DIAGNOSIS — L298 Other pruritus: Secondary | ICD-10-CM | POA: Insufficient documentation

## 2017-04-28 DIAGNOSIS — Z8711 Personal history of peptic ulcer disease: Secondary | ICD-10-CM

## 2017-04-28 DIAGNOSIS — F32A Depression, unspecified: Secondary | ICD-10-CM

## 2017-04-28 LAB — HM DIABETES EYE EXAM

## 2017-04-28 LAB — POCT GLYCOSYLATED HEMOGLOBIN (HGB A1C): HEMOGLOBIN A1C: 7.1

## 2017-04-28 LAB — GLUCOSE, CAPILLARY: Glucose-Capillary: 79 mg/dL (ref 65–99)

## 2017-04-28 NOTE — Patient Instructions (Addendum)
[name],  It was a pleasure meeting you today.  Your refills have been sent to the med assist program. I will call you with the results of your test that was done today.  Please follow up in a month to reassess your blood pressure.

## 2017-04-29 ENCOUNTER — Other Ambulatory Visit: Payer: Self-pay | Admitting: Internal Medicine

## 2017-04-29 LAB — CERVICOVAGINAL ANCILLARY ONLY: WET PREP (BD AFFIRM): POSITIVE — AB

## 2017-04-29 MED ORDER — METRONIDAZOLE 500 MG PO TABS: 500.0000 mg | ORAL_TABLET | Freq: Two times a day (BID) | ORAL | 0 refills | Status: AC

## 2017-05-02 ENCOUNTER — Emergency Department (HOSPITAL_COMMUNITY)
Admission: EM | Admit: 2017-05-02 | Discharge: 2017-05-03 | Disposition: A | Discharge status: Home / Self Care | Payer: Self-pay | Attending: Emergency Medicine | Admitting: Emergency Medicine

## 2017-05-02 ENCOUNTER — Emergency Department (HOSPITAL_COMMUNITY): Payer: Self-pay

## 2017-05-02 ENCOUNTER — Other Ambulatory Visit: Payer: Self-pay

## 2017-05-02 ENCOUNTER — Encounter (HOSPITAL_COMMUNITY): Payer: Self-pay

## 2017-05-02 DIAGNOSIS — R1013 Epigastric pain: Secondary | ICD-10-CM | POA: Insufficient documentation

## 2017-05-02 DIAGNOSIS — Z79899 Other long term (current) drug therapy: Secondary | ICD-10-CM | POA: Insufficient documentation

## 2017-05-02 DIAGNOSIS — E119 Type 2 diabetes mellitus without complications: Secondary | ICD-10-CM | POA: Insufficient documentation

## 2017-05-02 DIAGNOSIS — Z87891 Personal history of nicotine dependence: Secondary | ICD-10-CM | POA: Insufficient documentation

## 2017-05-02 DIAGNOSIS — K5900 Constipation, unspecified: Secondary | ICD-10-CM

## 2017-05-02 DIAGNOSIS — R197 Diarrhea, unspecified: Secondary | ICD-10-CM | POA: Insufficient documentation

## 2017-05-02 DIAGNOSIS — E079 Disorder of thyroid, unspecified: Secondary | ICD-10-CM | POA: Insufficient documentation

## 2017-05-02 DIAGNOSIS — I10 Essential (primary) hypertension: Secondary | ICD-10-CM | POA: Insufficient documentation

## 2017-05-02 DIAGNOSIS — K529 Noninfective gastroenteritis and colitis, unspecified: Secondary | ICD-10-CM

## 2017-05-02 LAB — BASIC METABOLIC PANEL
Anion gap: 13 (ref 5–15)
BUN: 8 mg/dL (ref 6–20)
CALCIUM: 9.5 mg/dL (ref 8.9–10.3)
CO2: 27 mmol/L (ref 22–32)
CREATININE: 0.89 mg/dL (ref 0.44–1.00)
Chloride: 96 mmol/L — ABNORMAL LOW (ref 101–111)
GFR calc Af Amer: >60 mL/min (ref >60)
GLUCOSE: 170 mg/dL — AB (ref 65–99)
Potassium: 3.4 mmol/L — ABNORMAL LOW (ref 3.5–5.1)
Sodium: 136 mmol/L (ref 135–145)

## 2017-05-02 LAB — I-STAT CG4 LACTIC ACID, ED: Lactic Acid, Venous: 3.01 mmol/L (ref 0.5–1.9)

## 2017-05-02 LAB — CBC
HCT: 41.6 % (ref 36.0–46.0)
HEMOGLOBIN: 13.9 g/dL (ref 12.0–15.0)
MCH: 31 pg (ref 26.0–34.0)
MCHC: 33.4 g/dL (ref 30.0–36.0)
MCV: 92.9 fL (ref 78.0–100.0)
PLATELETS: 419 10*3/uL — AB (ref 150–400)
RBC: 4.48 MIL/uL (ref 3.87–5.11)
RDW: 13.8 % (ref 11.5–15.5)
WBC: 9.4 10*3/uL (ref 4.0–10.5)

## 2017-05-02 LAB — I-STAT TROPONIN, ED: TROPONIN I, POC: 0 ng/mL (ref 0.00–0.08)

## 2017-05-02 MED ORDER — OXYCODONE-ACETAMINOPHEN 5-325 MG PO TABS: 1.0000 | ORAL_TABLET | ORAL | Status: DC | PRN

## 2017-05-02 NOTE — ED Triage Notes (Signed)
Pt endorse severe epigastric pain that began yesterday and is worse today. Endorse nausea, no vomiting. Pt unable to sit still in triage and appears to be in severe pain. Hypertensive in triage.

## 2017-05-02 NOTE — ED Notes (Signed)
Pt offered oxycodone in triage and declined.

## 2017-05-03 ENCOUNTER — Encounter: Payer: Self-pay | Admitting: *Deleted

## 2017-05-03 ENCOUNTER — Encounter (HOSPITAL_COMMUNITY): Payer: Self-pay | Admitting: Emergency Medicine

## 2017-05-03 ENCOUNTER — Encounter: Payer: Self-pay | Admitting: Dietician

## 2017-05-03 ENCOUNTER — Emergency Department (HOSPITAL_COMMUNITY): Payer: Self-pay

## 2017-05-03 LAB — URINALYSIS, ROUTINE W REFLEX MICROSCOPIC
BACTERIA UA: NONE SEEN
Bilirubin Urine: NEGATIVE
Glucose, UA: NEGATIVE mg/dL
HGB URINE DIPSTICK: NEGATIVE
Ketones, ur: 5 mg/dL — AB
Leukocytes, UA: NEGATIVE
NITRITE: NEGATIVE
PROTEIN: 30 mg/dL — AB
Specific Gravity, Urine: >1.046 — ABNORMAL HIGH (ref 1.005–1.030)
pH: 7 (ref 5.0–8.0)

## 2017-05-03 LAB — CYTOLOGY - PAP
CHLAMYDIA, DNA PROBE: NEGATIVE
Diagnosis: NEGATIVE
HPV (WINDOPATH): NOT DETECTED
NEISSERIA GONORRHEA: NEGATIVE

## 2017-05-03 LAB — I-STAT CG4 LACTIC ACID, ED: LACTIC ACID, VENOUS: 1.12 mmol/L (ref 0.5–1.9)

## 2017-05-03 MED ORDER — GI COCKTAIL ~~LOC~~
30.0000 mL | Freq: Once | ORAL | Status: AC
  Administered 2017-05-03: 30 mL via ORAL
  Filled 2017-05-03: qty 30

## 2017-05-03 MED ORDER — ONDANSETRON HCL 4 MG/2ML IJ SOLN
4.0000 mg | Freq: Once | INTRAMUSCULAR | Status: AC
  Administered 2017-05-03: 4 mg via INTRAVENOUS
  Filled 2017-05-03: qty 2

## 2017-05-03 MED ORDER — IOPAMIDOL (ISOVUE-370) INJECTION 76%
INTRAVENOUS | Status: AC
  Administered 2017-05-03: 100 mL
  Filled 2017-05-03: qty 100

## 2017-05-03 MED ORDER — SUCRALFATE 1 GM/10ML PO SUSP: 1.0000 g | Freq: Three times a day (TID) | ORAL | 0 refills | Status: DC

## 2017-05-03 MED ORDER — LANSOPRAZOLE 30 MG PO CPDR: 30.0000 mg | DELAYED_RELEASE_CAPSULE | Freq: Every day | ORAL | 0 refills | Status: DC

## 2017-05-03 MED ORDER — FENTANYL CITRATE (PF) 100 MCG/2ML IJ SOLN
50.0000 ug | Freq: Once | INTRAMUSCULAR | Status: AC
  Administered 2017-05-03: 50 ug via INTRAVENOUS
  Filled 2017-05-03: qty 2

## 2017-05-03 MED ORDER — SODIUM CHLORIDE 0.9 % IV BOLUS (SEPSIS)
1000.0000 mL | Freq: Once | INTRAVENOUS | Status: AC
  Administered 2017-05-03: 1000 mL via INTRAVENOUS

## 2017-05-03 NOTE — ED Notes (Signed)
Pt departed in NAD, refused use of wheelchair.  

## 2017-05-03 NOTE — ED Notes (Signed)
Patient transported to CT 

## 2017-05-03 NOTE — ED Notes (Signed)
Attempted to gain IV access for CT scan, without success.

## 2017-05-03 NOTE — ED Provider Notes (Signed)
[facility] DEPT Provider Note   CSN: [id number] Arrival date & time: 05/02/17  2229  By signing my name below, I, [name], attest that this documentation has been prepared under the direction and in the presence of [place], Monigue Spraggins, MD. Electronically Signed: Lise Auer, ED Scribe. 05/03/17. 12:25 AM.  History   Chief Complaint Chief Complaint  Patient presents with  . Abdominal Pain   The history is provided by the patient. No language interpreter was used.  Abdominal Pain   This is a chronic problem. The current episode started more than 1 week ago. The problem occurs daily. The problem has been gradually worsening. The pain is associated with an unknown factor. Pain location: upper abdomen. Associated symptoms include diarrhea and nausea. Pertinent negatives include fever and vomiting. Nothing aggravates the symptoms. Nothing relieves the symptoms. Past workup includes GI consult. Her past medical history is significant for PUD.    HPI Comments: Sally Wright is a 61 y.o. female  with a history of DM II, GERD, HTN, and HLD, who presents to the Emergency Department complaining of gradually worsening, intermittent epigastric pain that began one month ago but became consistent yesterday. She reports associated nausea.  Reports her LBM this morning was loose. She has been unable to pass fluctuance or belch. She is currently on Prontix for gastric ulcers but reports the medicine makes her nausea. Her last dosage was taken around 4pm today.  PSHx of hysterectomy. Denies emesis or fever.    Past Medical History:  Diagnosis Date  . Anxiety   . Depression   . Diabetes mellitus without complication ([place]) 2376  . GERD (gastroesophageal reflux disease)   . Hyperlipidemia   . Hypertension   . Thyroid disease     Patient Active Problem List   Diagnosis Date Noted  . Blurry vision 03/16/2017  . 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 Resistant  hypertension 08/02/2016  . DM (diabetes mellitus), type 2 ([place]) 08/02/2016  . Hyperlipidemia 08/02/2016  . Depression 08/02/2016  . History of peptic ulcer disease 08/02/2016  . Postoperative hypothyroidism 08/02/2016  . Osteoarthritis of both knees 08/02/2016  . Healthcare maintenance 08/02/2016    Past Surgical History:  Procedure Laterality Date  . ABDOMINAL HYSTERECTOMY  2009   Partial hysterectomy  . JOINT REPLACEMENT    . THYROIDECTOMY      OB History    No data available       Home Medications    Prior to Admission medications   Medication Sig Start Date End Date Taking? Authorizing Provider  amitriptyline (ELAVIL) 100 MG tablet Take 1 tablet (100 mg total) by mouth at bedtime. 03/15/17   Riccardo Dubin, MD  blood glucose meter kit and supplies KIT Check blood sugar before meals 3x/day for the first few days you are taking steroids 01/17/17   Lucious Groves, DO  FLUoxetine (PROZAC) 20 MG capsule Take 3 capsules (60 mg total) by mouth daily. Take 1 in the morning and 2 at night 12/07/16 12/07/17  Molt, Bethany, DO  glucose blood (WAVESENSE PRESTO) test strip Check blood sugar before meals 3x/day for the first few days you are taking steroids 01/17/17   Lucious Groves, DO  Lancets 30G MISC Check blood sugar before meals 3x/day for the first few days you are taking steroids 01/17/17   Lucious Groves, DO  levothyroxine (SYNTHROID, LEVOTHROID) 125 MCG tablet Take 1 tablet (125 mcg total) by mouth daily before breakfast. 03/15/17  Riccardo Dubin, MD  meloxicam (MOBIC) 7.5 MG tablet Take 1 tablet (7.5 mg total) by mouth daily. 03/15/17 03/15/18  Riccardo Dubin, MD  metroNIDAZOLE (FLAGYL) 500 MG tablet Take 1 tablet (500 mg total) by mouth 2 (two) times daily. 04/29/17 05/06/17  Kalman Shan Ratliff, DO  rifampin (RIFADIN) 300 MG capsule Take 600 mg by mouth daily.    [provider]    Family History Family History  Problem Relation Age of Onset  . Goiter Mother   .  Colon cancer Father   . Goiter Father   . Breast cancer Sister   . Heart disease Sister   . Breast cancer Maternal Aunt     Social History Social History  Substance Use Topics  . Smoking status: Former Smoker    Packs/day: 0.50    Years: 10.00    Types: Cigarettes  . Smokeless tobacco: Never Used     Comment: Quit 30 years ago  . Alcohol use No     Allergies   Carvedilol   Review of Systems Review of Systems  Constitutional: Negative for fever.  Gastrointestinal: Positive for abdominal pain, diarrhea and nausea. Negative for vomiting.  All other systems reviewed and are negative.   Physical Exam Updated Vital Signs BP (!) 154/120 (BP Location: Right Arm)   Pulse (!) 106   Temp 99.2 F (37.3 C) (Oral)   Resp 20   Ht 5' 6"  (1.676 m)   Wt 253 lb (114.8 kg)   SpO2 99%   BMI 40.84 kg/m   Physical Exam  Constitutional: She appears well-developed and well-nourished.  HENT:  Head: Normocephalic.  Mouth/Throat: Oropharynx is clear and moist. No oropharyngeal exudate.  Eyes: Conjunctivae and EOM are normal. Pupils are equal, round, and reactive to light. Right eye exhibits no discharge. Left eye exhibits no discharge. No scleral icterus.  Neck: Normal range of motion. Neck supple. No JVD present. No tracheal deviation present.  Trachea is midline. No stridor or carotid bruits.   Cardiovascular: Normal rate, regular rhythm, normal heart sounds and intact distal pulses.   No murmur heard. Pulmonary/Chest: Effort normal and breath sounds normal. No stridor. No respiratory distress. She has no wheezes. She has no rales.  Lungs CTA bilaterally.  Abdominal: Soft. Bowel sounds are normal. She exhibits no mass. There is no tenderness. There is no rebound and no guarding.  Musculoskeletal: Normal range of motion. She exhibits no edema or tenderness.  All compartments are soft. No palpable cords.   Lymphadenopathy:    She has no cervical adenopathy.  Neurological: She is  alert. She has normal reflexes. She displays normal reflexes. She exhibits normal muscle tone.  Skin: Skin is warm and dry. Capillary refill takes less than 2 seconds.  Psychiatric: She has a normal mood and affect. Her behavior is normal.  Nursing note and vitals reviewed.   ED Treatments / Results   Vitals:   05/03/17 0400 05/03/17 0445  BP: (!) 152/104 (!) 139/100  Pulse: 89 88  Resp: (!) 25 19  Temp:      DIAGNOSTIC STUDIES: Oxygen Saturation is 99% on RA, normal by my interpretation.   COORDINATION OF CARE: 12:24 AM-Discussed next steps with pt. Pt verbalized understanding and is agreeable with the plan.   Labs (all labs ordered are listed, but only abnormal results are displayed) Labs Reviewed  BASIC METABOLIC PANEL - Abnormal; Notable for the following:       Result Value   Potassium 3.4 (*)  Chloride 96 (*)    Glucose, Bld 170 (*)    All other components within normal limits  CBC - Abnormal; Notable for the following:    Platelets 419 (*)    All other components within normal limits  I-STAT CG4 LACTIC ACID, ED - Abnormal; Notable for the following:    Lactic Acid, Venous 3.01 (*)    All other components within normal limits  URINALYSIS, ROUTINE W REFLEX MICROSCOPIC  I-STAT TROPOININ, ED    EKG  EKG Interpretation  Date/Time:  Monday May 02 2017 22:38:54 EDT Ventricular Rate:  105 PR Interval:  156 QRS Duration: 86 QT Interval:  340 QTC Calculation: 449 R Axis:   -8 Text Interpretation:  Sinus tachycardia with occasional Premature ventricular complexes Possible Left atrial enlargement ST & T wave abnormality, consider lateral ischemia Confirmed by Randal Buba, Zuri Bradway ([id number]) on 05/02/2017 11:45:53 PM       Radiology  Results for orders placed or performed during the hospital encounter of 53/29/92  Basic metabolic panel  Result Value Ref Range   Sodium 136 135 - 145 mmol/L   Potassium 3.4 (L) 3.5 - 5.1 mmol/L   Chloride 96 (L) 101 - 111 mmol/L   CO2  27 22 - 32 mmol/L   Glucose, Bld 170 (H) 65 - 99 mg/dL   BUN 8 6 - 20 mg/dL   Creatinine, Ser 0.89 0.44 - 1.00 mg/dL   Calcium 9.5 8.9 - 10.3 mg/dL   GFR calc non Af Amer >60 >60 mL/min   GFR calc Af Amer >60 >60 mL/min   Anion gap 13 5 - 15  CBC  Result Value Ref Range   WBC 9.4 4.0 - 10.5 K/uL   RBC 4.48 3.87 - 5.11 MIL/uL   Hemoglobin 13.9 12.0 - 15.0 g/dL   HCT 41.6 36.0 - 46.0 %   MCV 92.9 78.0 - 100.0 fL   MCH 31.0 26.0 - 34.0 pg   MCHC 33.4 30.0 - 36.0 g/dL   RDW 13.8 11.5 - 15.5 %   Platelets 419 (H) 150 - 400 K/uL  Urinalysis, Routine w reflex microscopic  Result Value Ref Range   Color, Urine YELLOW YELLOW   APPearance CLEAR CLEAR   Specific Gravity, Urine >1.046 (H) 1.005 - 1.030   pH 7.0 5.0 - 8.0   Glucose, UA NEGATIVE NEGATIVE mg/dL   Hgb urine dipstick NEGATIVE NEGATIVE   Bilirubin Urine NEGATIVE NEGATIVE   Ketones, ur 5 (A) NEGATIVE mg/dL   Protein, ur 30 (A) NEGATIVE mg/dL   Nitrite NEGATIVE NEGATIVE   Leukocytes, UA NEGATIVE NEGATIVE   RBC / HPF 0-5 0 - 5 RBC/hpf   WBC, UA 0-5 0 - 5 WBC/hpf   Bacteria, UA NONE SEEN NONE SEEN   Squamous Epithelial / LPF 0-5 (A) NONE SEEN   Mucous PRESENT   I-stat troponin, ED  Result Value Ref Range   Troponin i, poc 0.00 0.00 - 0.08 ng/mL   Comment 3          I-Stat CG4 Lactic Acid, ED  Result Value Ref Range   Lactic Acid, Venous 3.01 (HH) 0.5 - 1.9 mmol/L   Comment NOTIFIED PHYSICIAN   I-Stat CG4 Lactic Acid, ED  Result Value Ref Range   Lactic Acid, Venous 1.12 0.5 - 1.9 mmol/L   Dg Chest 2 View  Result Date: 05/02/2017 CLINICAL DATA:  Epigastric pain EXAM: CHEST  2 VIEW COMPARISON:  11/15/2016 FINDINGS: Elevation of the left diaphragm. Low lung volumes. No pleural effusion  or focal consolidation. Borderline cardiomegaly. Aortic atherosclerosis. No pneumothorax. Surgical clips at the base of the neck. Mild gaseous dilatation of bowel loops in the upper abdomen IMPRESSION: Low lung volumes.  No acute infiltrate or  edema Electronically Signed   By: Donavan Foil M.D.   On: 05/02/2017 23:02   Ct Angio Abdomen Pelvis  W &/or Wo Contrast  Result Date: 05/03/2017 CLINICAL DATA:  61 year old female with upper abdominal pain. Evaluate for mesenteric ischemia. History of gastric ulcer. EXAM: CTA ABDOMEN AND PELVIS wITHOUT AND WITH CONTRAST TECHNIQUE: Multidetector CT imaging of the abdomen and pelvis was performed using the standard protocol during bolus administration of intravenous contrast. Multiplanar reconstructed images and MIPs were obtained and reviewed to evaluate the vascular anatomy. CONTRAST:  100 cc Isovue 370 COMPARISON:  None. FINDINGS: VASCULAR Aorta: Mild atherosclerotic calcification. No aneurysmal dilatation or evidence of dissection. Celiac: Patent without evidence of aneurysm, dissection, vasculitis or significant stenosis. SMA: Patent without evidence of aneurysm, dissection, vasculitis or significant stenosis. Renals: Both renal arteries are patent without evidence of aneurysm, dissection, vasculitis, fibromuscular dysplasia or significant stenosis. IMA: Patent without evidence of aneurysm, dissection, vasculitis or significant stenosis. Inflow: Patent without evidence of aneurysm, dissection, vasculitis or significant stenosis. Mild atherosclerosis. Proximal Outflow: Bilateral common femoral and visualized portions of the superficial and profunda femoral arteries are patent without evidence of aneurysm, dissection, vasculitis or significant stenosis. Veins: No obvious venous abnormality within the limitations of this arterial phase study. Review of the MIP images confirms the above findings. NON-VASCULAR Lower chest: Bibasilar linear atelectasis/ scarring. The visualized lung bases are otherwise clear. No intra-abdominal free air.  Small ascites. Hepatobiliary: There is a 17 x 11 mm cyst in the left lobe of the liver. There is apparent mild fatty infiltration of the liver. No intrahepatic biliary ductal  dilatation. There is a 2 cm rim calcified stone in the gallbladder. No pericholecystic fluid. Ultrasound may provide better evaluation of the gallbladder if clinically indicated. Pancreas: Unremarkable. No pancreatic ductal dilatation or surrounding inflammatory changes. Spleen: Normal in size without focal abnormality. Adrenals/Urinary Tract: Left adrenal thickening/ hyperplasia. The right adrenal gland appears unremarkable. The kidneys, visualized ureters, and urinary bladder appear unremarkable. Probable small left renal inferior pole cyst. Stomach/Bowel: There is moderate stool throughout the colon. S there is postsurgical changes of bowel with anastomotic suture in the left lower abdomen. There is abutment of adjacent loops of distal small bowel in the left hemipelvis most consistent with adhesions. There is inflammatory changes of small bowel loops in lower abdomen with mild wall thickening and mucosal enhancement most likely representing enteritis. No pneumatosis. Mild dilatation of bowel loops likely represent reactive ileus. Lymphatic: No adenopathy. Reproductive: Hysterectomy. Other: Midline vertical anterior abdominal wall incisional scar. Small fat containing umbilical hernia. No fluid collection. Musculoskeletal: Degenerative changes of the spine. No acute fracture. IMPRESSION: VASCULAR No acute findings.  No occlusion or portal venous gas. NON-VASCULAR Postsurgical changes of bowel with evidence of adhesions of loops of small bowel in the lower abdomen and pelvis. There is inflammatory changes of the small bowel likely representing enteritis. No pneumatosis, portal venous gas, or free air or other evidence of mesenteric ischemia. Moderate colonic stool burden. Gallstone. Electronically Signed   By: Anner Crete M.D.   On: 05/03/2017 04:41    Procedures Procedures (including critical care time)  Medications Ordered in ED  Medications  gi cocktail (Maalox,Lidocaine,Donnatal) (not  administered)  sodium chloride 0.9 % bolus 1,000 mL (0 mLs Intravenous Stopped 05/03/17 0225)  fentaNYL (SUBLIMAZE) injection 50 mcg (50 mcg Intravenous Given 05/03/17 0042)  ondansetron (ZOFRAN) injection 4 mg (4 mg Intravenous Given 05/03/17 0040)  iopamidol (ISOVUE-370) 76 % injection (100 mLs  Contrast Given 05/03/17 0331)     Case d/w Dr. Jerelyn Scott by phone, there are no signs of ischemic bowel, acute biliary disease, nor SBO.  Appears to be enteritis with moderate stool burden.   Final Clinical Impressions(s) / ED Diagnoses  Follow up with your PMD in 2 days.  Return for weakness, inability to tolerate oral medication or liquids, worsening pain, fevers, altered level of consciousness,bleeding or any concerns. Will change protonix to zantac as patient is having significant side effects from same.  Follow up with GI as an outpatient and surgery electively for discussion regarding GB removal.    The patient is nontoxic-appearing on exam and vital signs are within normal limits.   I have reviewed the triage vital signs and the nursing notes. Pertinent labs &imaging results that were available during my care of the patient were reviewed by me and considered in my medical decision making (see chart for details).  After history, exam, and medical workup I feel the patient has been appropriately medically screened and is safe for discharge home. Pertinent diagnoses were discussed with the patient. Patient was given return precautions.    I personally performed the services described in this documentation, which was scribed in my presence. The recorded information has been reviewed and is accurate.       Sorah Falkenstein, MD 05/03/17 [phone]

## 2017-05-04 ENCOUNTER — Telehealth: Payer: Self-pay | Admitting: Internal Medicine

## 2017-05-04 DIAGNOSIS — N898 Other specified noninflammatory disorders of vagina: Secondary | ICD-10-CM | POA: Insufficient documentation

## 2017-05-04 NOTE — Telephone Encounter (Signed)
NEEDS REFILL ON MEDS, SHE SAW [facility] LAST Thursday, PHARMACY [phone], PATIENT NEW NUMBER [phone]

## 2017-05-04 NOTE — Telephone Encounter (Signed)
Patient requests MD to call her back. Was seen 04/28/17 & was told med [facility] but has not received any. She is out of meds for her bp & diabetes. Thanks! Patient's phone [phone]

## 2017-05-04 NOTE — Assessment & Plan Note (Addendum)
Assessment:  Type II diabetes Patient reports compliance with metformin 500mg  two times daily.  Patient's hemoglobin A1C was 7.1 stable and at goal. Due to financial reasons patient is now enrolled in the [place] med assist pharmacy and will need to change medications to those supported by the program.  Will change to metformin ER 500mg  BID  Plan - metformin ER 500mg  BID

## 2017-05-04 NOTE — Assessment & Plan Note (Signed)
Assessment: Vaginal itching Patient presents with a one month history of vaginal itching.  She has associated symptoms of vaginal discharge and odor.  She denies being sexually active for the past several years.  She denies pain, urinary frequency, urinary urgency or hematuria  Plan -GC   -pap smear - wet prep  Addendum:  Patient's wet prep was positive for Gardnerella, negative for HPV, negative for GC.  Sent prescription of metronidazole to the pharmacy and spoke with patient about results on 7/6

## 2017-05-04 NOTE — Progress Notes (Signed)
   CC: Follow up on essential hypertension and type II diabetes  HPI:  [name] is a 61 y.o. female with history noted below that presents to the Internal Medicine Clinic for follow up on essential hypertension and type II diabetes.  Please see problem based charting for the status of the patients chronic medical condition.    Past Medical History:  Diagnosis Date  . Anxiety   . Depression   . Diabetes mellitus without complication ([facility]) 1996  . GERD (gastroesophageal reflux disease)   . Hyperlipidemia   . Hypertension   . Thyroid disease     Review of Systems:  Review of Systems  Respiratory: Negative for shortness of breath.   Cardiovascular: Negative for chest pain.  Genitourinary: Negative for dysuria, frequency and urgency.       Vaginal itching  Neurological: Negative for headaches.     Physical Exam:  Vitals:   04/28/17 1411  BP: (!) 171/108  Pulse: 74  Temp: 98.2 F (36.8 C)  TempSrc: Oral  SpO2: 97%  Weight: 253 lb 1.6 oz (114.8 kg)   Physical Exam  Constitutional: She is well-developed, well-nourished, and in no distress.  obese  Cardiovascular: Normal rate, regular rhythm and normal heart sounds.  Exam reveals no gallop and no friction rub.   No murmur heard. Pulmonary/Chest: Effort normal and breath sounds normal. No respiratory distress. She has no wheezes. She has no rales.  Genitourinary: Cervix normal. Vaginal discharge found.    Assessment & Plan:   See encounters tab for problem based medical decision making.   Patient seen with Dr. Heide SparkNarendra

## 2017-05-05 ENCOUNTER — Other Ambulatory Visit: Payer: Self-pay | Admitting: Internal Medicine

## 2017-05-05 ENCOUNTER — Telehealth: Payer: Self-pay | Admitting: Internal Medicine

## 2017-05-05 MED ORDER — OLMESARTAN-AMLODIPINE-HCTZ 40-10-25 MG PO TABS: 1.0000 | ORAL_TABLET | Freq: Every day | ORAL | 0 refills | Status: DC

## 2017-05-05 MED ORDER — LOSARTAN POTASSIUM-HCTZ 100-25 MG PO TABS: 1.0000 | ORAL_TABLET | Freq: Every day | ORAL | 11 refills | Status: DC

## 2017-05-05 MED ORDER — METFORMIN HCL ER 500 MG PO TB24: 500.0000 mg | ORAL_TABLET | Freq: Two times a day (BID) | ORAL | 11 refills | Status: DC

## 2017-05-05 MED ORDER — METOPROLOL TARTRATE 25 MG PO TABS: 25.0000 mg | ORAL_TABLET | Freq: Two times a day (BID) | ORAL | 11 refills | Status: DC

## 2017-05-05 MED ORDER — AMLODIPINE BESYLATE 10 MG PO TABS: 10.0000 mg | ORAL_TABLET | Freq: Every day | ORAL | 11 refills | Status: DC

## 2017-05-05 MED ORDER — LEVOTHYROXINE SODIUM 125 MCG PO TABS: 125.0000 ug | ORAL_TABLET | Freq: Every day | ORAL | 3 refills | Status: DC

## 2017-05-05 MED ORDER — ATENOLOL 50 MG PO TABS: 50.0000 mg | ORAL_TABLET | Freq: Every day | ORAL | 0 refills | Status: DC

## 2017-05-05 MED ORDER — FLUOXETINE HCL 20 MG PO CAPS: 60.0000 mg | ORAL_CAPSULE | Freq: Every day | ORAL | 11 refills | Status: DC

## 2017-05-05 MED ORDER — METFORMIN HCL 500 MG PO TABS: 500.0000 mg | ORAL_TABLET | Freq: Two times a day (BID) | ORAL | 0 refills | Status: DC

## 2017-05-05 NOTE — Assessment & Plan Note (Signed)
Assessment:  Essential hypertension  Patient states she takes atenolol 50mg  daily and olmesartan-amlodipine-HCTZ 40-10-25.  She states she has not been taking the medication for the past few days due to running out.  Blood pressure in office was 171/108.  Due to financial reasons patient is now enrolled in the [place] med assist pharmacy and will need to change medications to those supported by the program.  Will change medications to losartan-HCTZ 100-25 and metroprolol tartrate 25 BID and amlodipine 10mg .  Plan - losartan-HCTZ 100-25  -metroprolol tartrate 25 BID  -amlodipine 10mg  - told to follow up in one month

## 2017-05-05 NOTE — Telephone Encounter (Signed)
Sent prescriptions to [place] [facility] pharmacy however medications will  take up to 10 days for patient to receive.  Sent a 2 week supply of blood pressure medications and metformin to outpatient [place] pharmacy in the meantime.  Spoke with patient this morning and is agreeable with plan.

## 2017-05-05 NOTE — Telephone Encounter (Signed)
Sent prescriptions to [place] [facility] pharmacy and called pharmacy to confirm.  Sally SpanielSaid it would take up to 10 days for patient to receive.  Sent a 2 week supply of blood pressure medications and metformin to outpatient [facility] pharmacy in the meantime.  Spoke with patient this morning and is agreeable with plan.

## 2017-05-05 NOTE — Progress Notes (Signed)
Internal Medicine Clinic Attending  I saw and evaluated the patient.  I personally confirmed the key portions of the history and exam documented by Dr. Hoffman and I reviewed pertinent patient test results.  The assessment, diagnosis, and plan were formulated together and I agree with the documentation in the resident's note.      

## 2017-05-09 ENCOUNTER — Encounter: Payer: Self-pay | Admitting: Internal Medicine

## 2017-05-16 ENCOUNTER — Encounter: Payer: Self-pay | Admitting: Internal Medicine

## 2017-05-19 LAB — HM DIABETES EYE EXAM

## 2017-05-26 ENCOUNTER — Encounter: Payer: Self-pay | Admitting: Internal Medicine

## 2017-05-26 ENCOUNTER — Ambulatory Visit: Payer: Self-pay

## 2017-05-26 ENCOUNTER — Ambulatory Visit (HOSPITAL_COMMUNITY)
Admission: RE | Admit: 2017-05-26 | Discharge: 2017-05-26 | Disposition: A | Discharge status: Home / Self Care | Payer: Self-pay | Source: Ambulatory Visit | Attending: Internal Medicine | Admitting: Internal Medicine

## 2017-05-26 ENCOUNTER — Ambulatory Visit (INDEPENDENT_AMBULATORY_CARE_PROVIDER_SITE_OTHER): Payer: Self-pay | Admitting: Internal Medicine

## 2017-05-26 VITALS — BP 181/86 | HR 59 | Temp 98.2°F | Ht 66.0 in | Wt 247.7 lb

## 2017-05-26 DIAGNOSIS — I10 Essential (primary) hypertension: Secondary | ICD-10-CM | POA: Insufficient documentation

## 2017-05-26 DIAGNOSIS — Z79899 Other long term (current) drug therapy: Secondary | ICD-10-CM

## 2017-05-26 DIAGNOSIS — I499 Cardiac arrhythmia, unspecified: Secondary | ICD-10-CM

## 2017-05-26 DIAGNOSIS — Z87891 Personal history of nicotine dependence: Secondary | ICD-10-CM

## 2017-05-26 DIAGNOSIS — Z8349 Family history of other endocrine, nutritional and metabolic diseases: Secondary | ICD-10-CM

## 2017-05-26 DIAGNOSIS — I493 Ventricular premature depolarization: Secondary | ICD-10-CM | POA: Insufficient documentation

## 2017-05-26 DIAGNOSIS — E890 Postprocedural hypothyroidism: Secondary | ICD-10-CM

## 2017-05-26 DIAGNOSIS — E039 Hypothyroidism, unspecified: Secondary | ICD-10-CM

## 2017-05-26 DIAGNOSIS — R140 Abdominal distension (gaseous): Secondary | ICD-10-CM

## 2017-05-26 DIAGNOSIS — I498 Other specified cardiac arrhythmias: Secondary | ICD-10-CM | POA: Insufficient documentation

## 2017-05-26 DIAGNOSIS — R9431 Abnormal electrocardiogram [ECG] [EKG]: Secondary | ICD-10-CM | POA: Insufficient documentation

## 2017-05-26 LAB — GLUCOSE, CAPILLARY: GLUCOSE-CAPILLARY: 134 mg/dL — AB (ref 65–99)

## 2017-05-26 MED ORDER — EPLERENONE 50 MG PO TABS: 50.0000 mg | ORAL_TABLET | Freq: Every day | ORAL | 3 refills | Status: DC

## 2017-05-26 NOTE — Progress Notes (Signed)
   CC: Follow up on essential hypertension  HPI:  [name] is a 61 y.o. female with history noted below that presents to the Internal medicine clinic for follow up on her essential hypertension.  Past Medical History:  Diagnosis Date  . Anxiety   . Depression   . Diabetes mellitus without complication ([facility]) 1996  . GERD (gastroesophageal reflux disease)   . Hyperlipidemia   . Hypertension   . Thyroid disease     Review of Systems:  Review of Systems  Respiratory: Negative for shortness of breath.   Cardiovascular: Negative for chest pain.  Gastrointestinal: Positive for abdominal pain. Negative for nausea and vomiting.  Skin: Negative for rash.  Neurological: Negative for tremors.     Physical Exam:  Vitals:   05/26/17 1426  BP: (!) 190/91  Pulse: 80  Temp: 98.2 F (36.8 C)  TempSrc: Oral  SpO2: 97%  Weight: 247 lb 11.2 oz (112.4 kg)  Height: 5\' 6"  (1.676 m)   Physical Exam  Constitutional:  Sweating   Cardiovascular: Normal rate.  Exam reveals no gallop and no friction rub.   No murmur heard. Irregular rhythm   Pulmonary/Chest: Effort normal and breath sounds normal. No respiratory distress. She has no wheezes. She has no rales.  Abdominal: Soft. She exhibits no distension. There is no tenderness.    Assessment & Plan:   See encounters tab for problem based medical decision making.     Patient discussed with Dr. Criselda PeachesMullen

## 2017-05-26 NOTE — Patient Instructions (Addendum)
Sally Wright,  It was a pleasure seeing you today. Please start using Gas-X (simeticone) and Dulcolax (docusate) to help with symptoms relief.    Please stop taking metoprolol and start taking eplerenone daily.  Please follow up 1 week after you start taking the eplerenone for blood work

## 2017-05-27 LAB — BMP8+ANION GAP
ANION GAP: 18 mmol/L (ref 10.0–18.0)
BUN / CREAT RATIO: 16 (ref 12–28)
BUN: 12 mg/dL (ref 8–27)
CHLORIDE: 100 mmol/L (ref 96–106)
CO2: 26 mmol/L (ref 20–29)
Calcium: 9.8 mg/dL (ref 8.7–10.3)
Creatinine, Ser: 0.73 mg/dL (ref 0.57–1.00)
GFR calc Af Amer: 102 mL/min/{1.73_m2} (ref >59)
GFR calc non Af Amer: 89 mL/min/{1.73_m2} (ref >59)
GLUCOSE: 110 mg/dL — AB (ref 65–99)
POTASSIUM: 4.1 mmol/L (ref 3.5–5.2)
SODIUM: 144 mmol/L (ref 134–144)

## 2017-05-27 LAB — TSH: TSH: 12.04 u[IU]/mL — AB (ref 0.450–4.500)

## 2017-05-27 LAB — MAGNESIUM: MAGNESIUM: 2.2 mg/dL (ref 1.6–2.3)

## 2017-05-29 DIAGNOSIS — R140 Abdominal distension (gaseous): Secondary | ICD-10-CM | POA: Insufficient documentation

## 2017-05-29 MED ORDER — LEVOTHYROXINE SODIUM 137 MCG PO TABS: 137.0000 ug | ORAL_TABLET | Freq: Every day | ORAL | 0 refills | Status: DC

## 2017-05-29 NOTE — Assessment & Plan Note (Addendum)
Assessment: hypothyroidism  Patient reports symptoms of excessive sweating and heart palpitations.  Patient is currently taking synthroid 125mg . Will check TSH   Plan TSH  Addendum TSH was elevated at 12, will increase synthroid to 137mcg.

## 2017-05-29 NOTE — Assessment & Plan Note (Signed)
Assessment: GI discomfort Patient states that for the past several weeks she has experienced stomach discomfort.  She reports bloating and crampy like pain after eating.  Patient has an appointment with GI next month.  Recommended using gas x and stool softener for when she is constipated.    plan Gas x  Stool softner

## 2017-05-29 NOTE — Assessment & Plan Note (Addendum)
Assessment:  Essential hypertension   Due to financial reasons patient is now enrolled in the [place] med assist pharmacy and blood pressure medications were changed to  losartan-HCTZ 100-25 and metroprolol tartrate 25 BID and amlodipine 10mg  daily on clinic visit 05/05/17.  Today's blood pressure is 190/91 and on repeat 181/86.  Patient also states she thinks she is having side effects from metoprolol because she states she hasn't felt well since taking the medication.  She reports increased sweating.  Will discontinue metoprolol and add eplerenone 50mg  daily.  Patient had a irregular heart beat on exam.  An EKG was obtained and noted sinus rhythm with occasional PVCs. This rhythm was noted on other EKGs previously.  Will obtain an echo to assess for structural heart disease.  Plan - losartan-HCTZ 100-25  -amlodipine 10mg  - add eplerenone 50mg   - ECHO - BMET and mag - follow up for blood work after taking eplerenone for one week - follow up with me in 1-2 months

## 2017-05-30 NOTE — Progress Notes (Signed)
Internal Medicine Clinic Attending  Case discussed with Dr. Hoffman at the time of the visit.  We reviewed the resident's history and exam and pertinent patient test results.  I agree with the assessment, diagnosis, and plan of care documented in the resident's note.  

## 2017-06-09 ENCOUNTER — Ambulatory Visit: Payer: Self-pay

## 2017-06-09 ENCOUNTER — Ambulatory Visit (HOSPITAL_COMMUNITY)
Admission: RE | Admit: 2017-06-09 | Discharge: 2017-06-09 | Disposition: A | Discharge status: Home / Self Care | Payer: Self-pay | Source: Ambulatory Visit | Attending: Internal Medicine | Admitting: Internal Medicine

## 2017-06-09 DIAGNOSIS — I10 Essential (primary) hypertension: Secondary | ICD-10-CM | POA: Insufficient documentation

## 2017-06-09 DIAGNOSIS — I340 Nonrheumatic mitral (valve) insufficiency: Secondary | ICD-10-CM | POA: Insufficient documentation

## 2017-06-09 NOTE — Progress Notes (Signed)
  Echocardiogram 2D Echocardiogram has been performed.  Nolon RodBrown, Tony 06/09/2017, 3:59 PM

## 2017-06-22 IMAGING — CR DG CERVICAL SPINE COMPLETE 4+V
5 series · 5 of 5 positions shown · non-contrast
Comparison: None.

CLINICAL DATA: Left-sided neck pain, no known injury, initial
encounter

EXAM:
CERVICAL SPINE - COMPLETE 4+ VIEW

[c-spine lat]
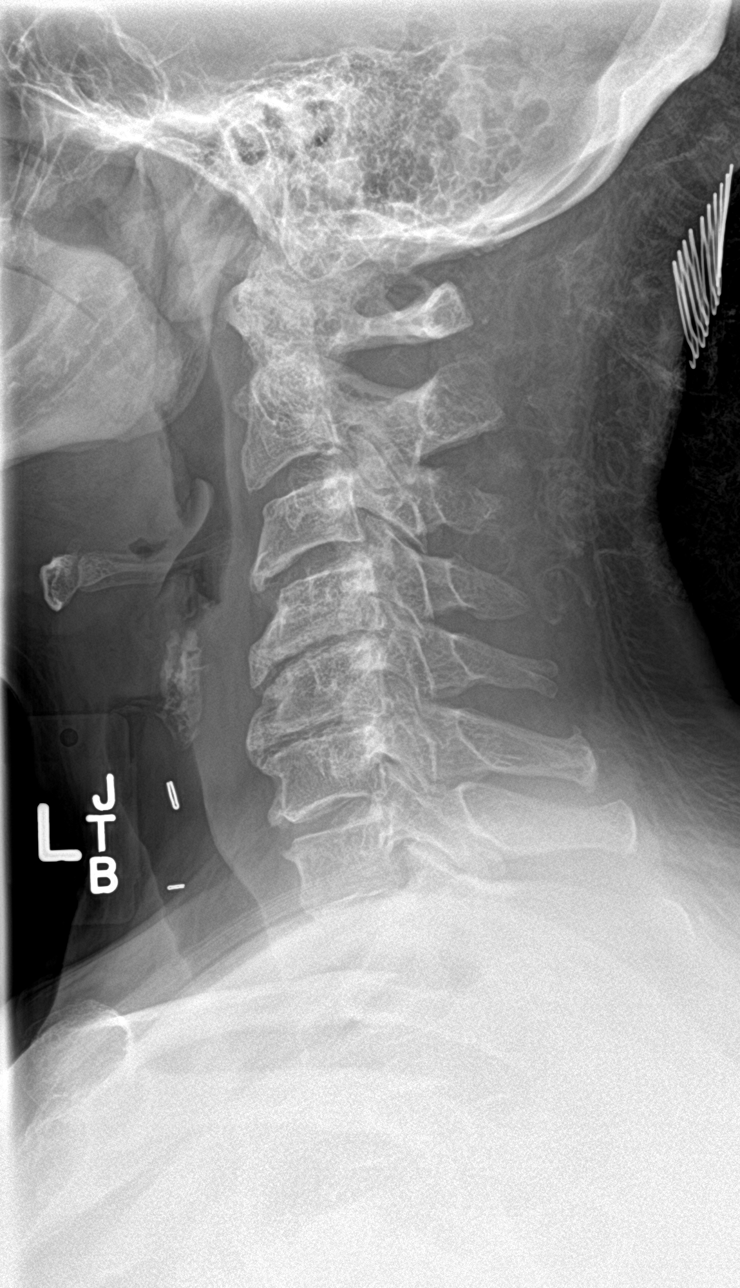

[c-spine obl (1 of 2)]
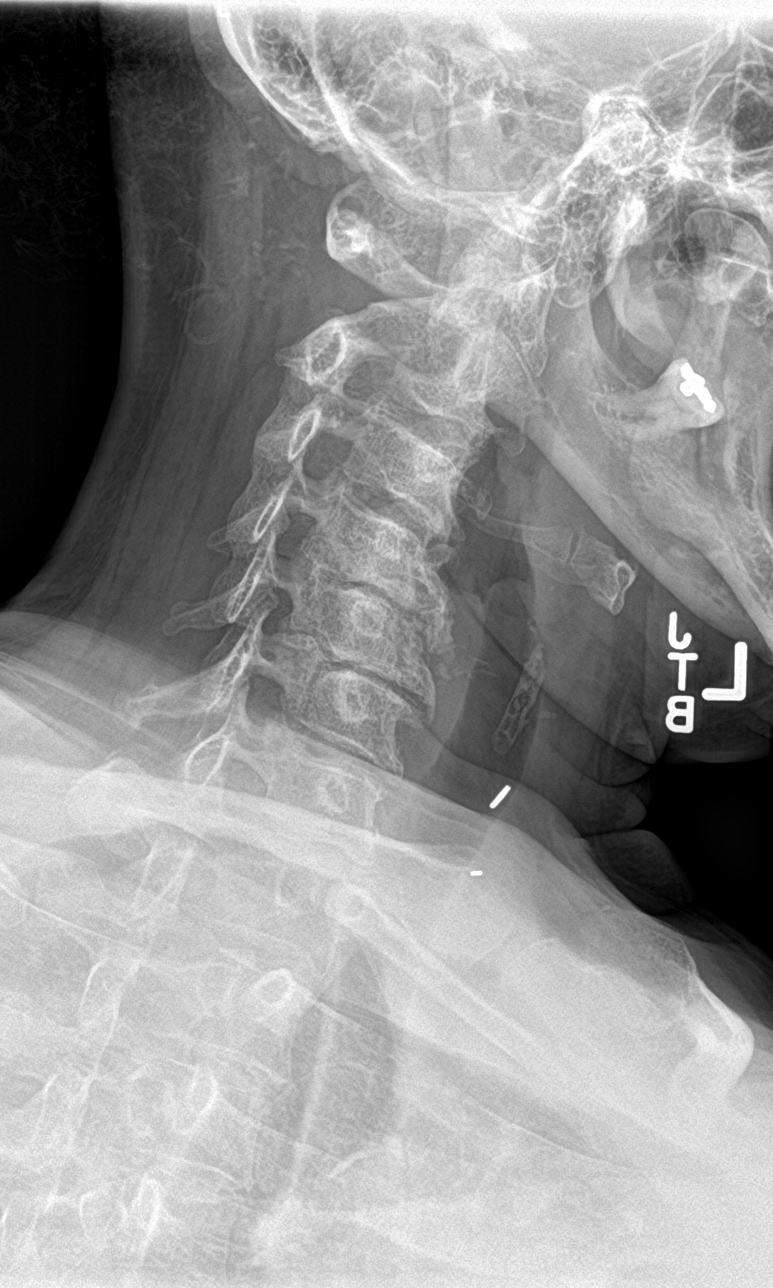

[c-spine obl (2 of 2)]
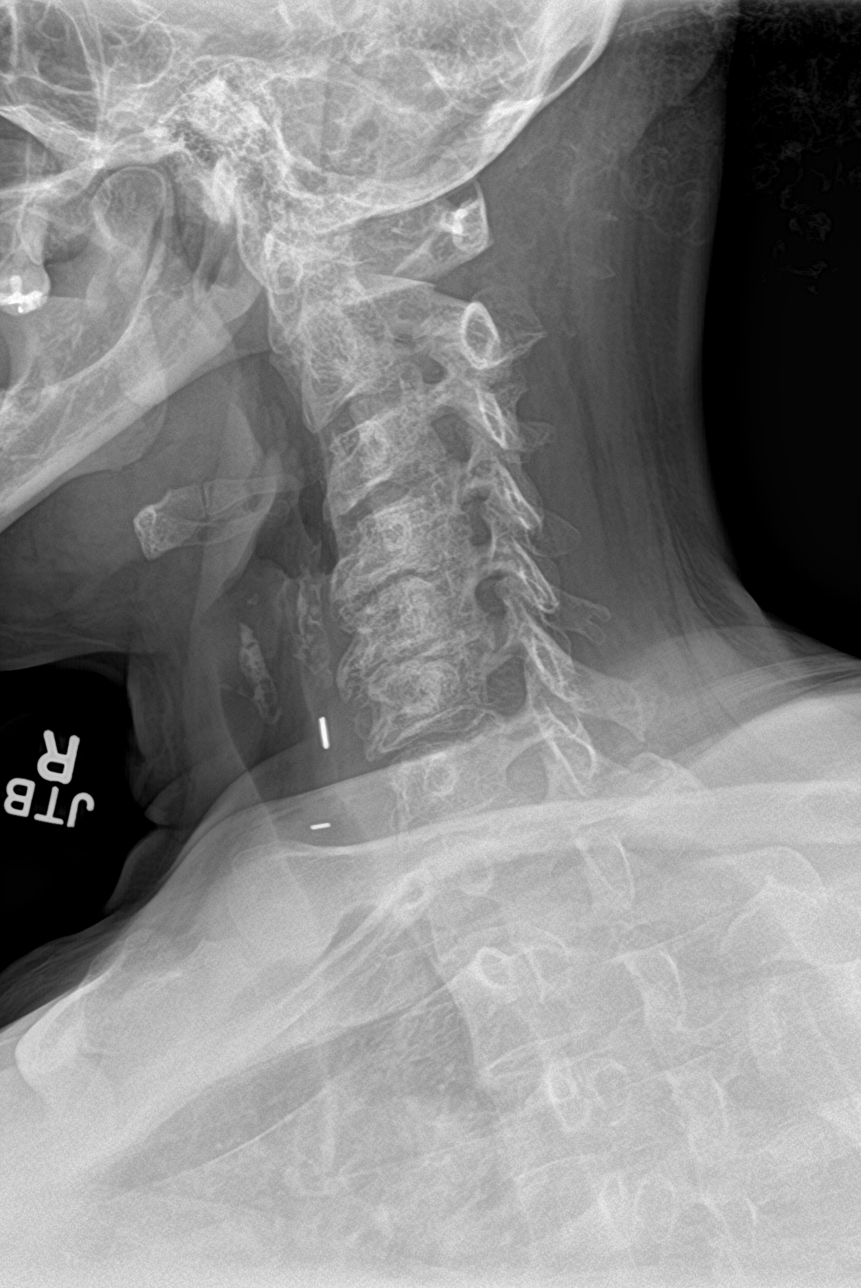

[c-spine ap]
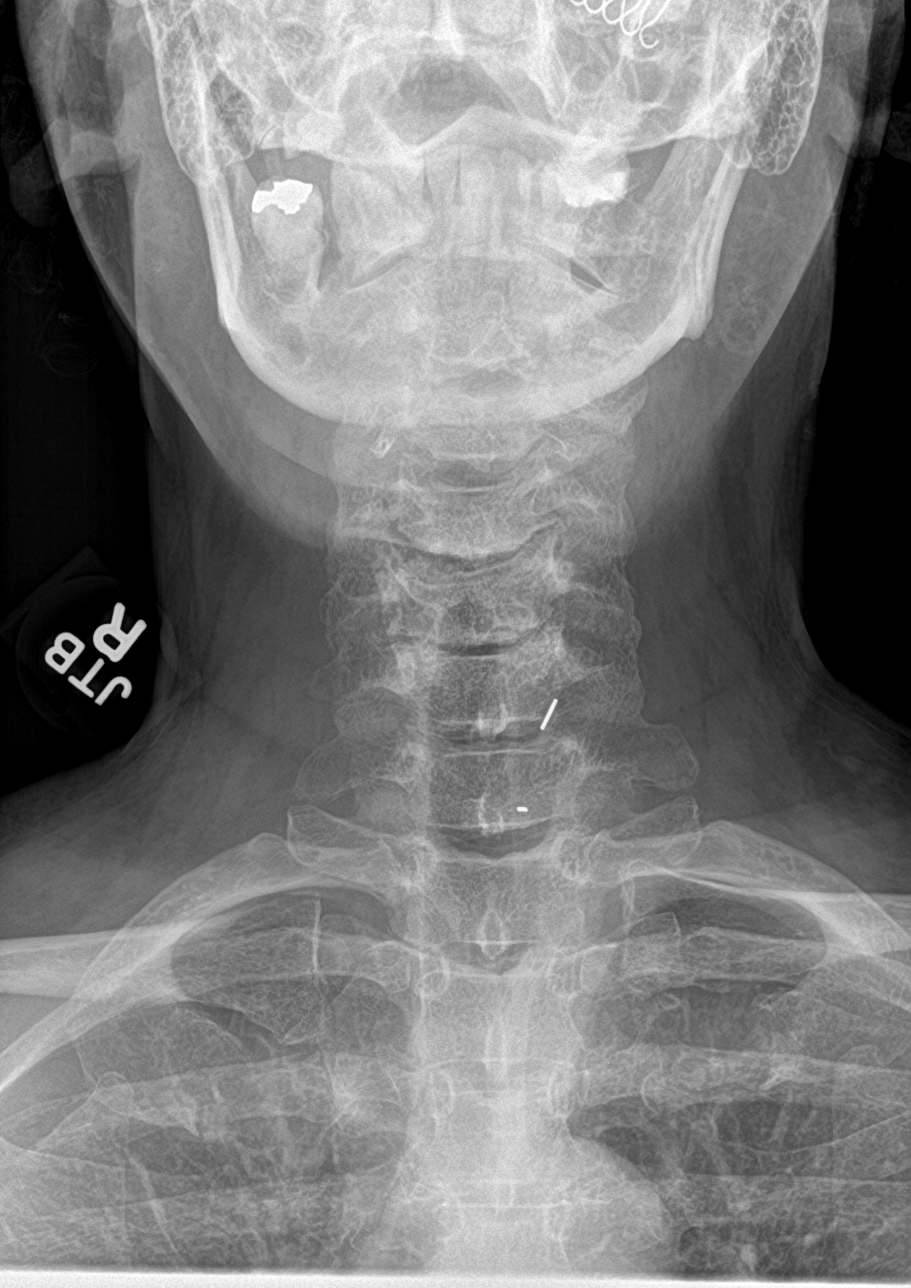

[c-spine open mouth]
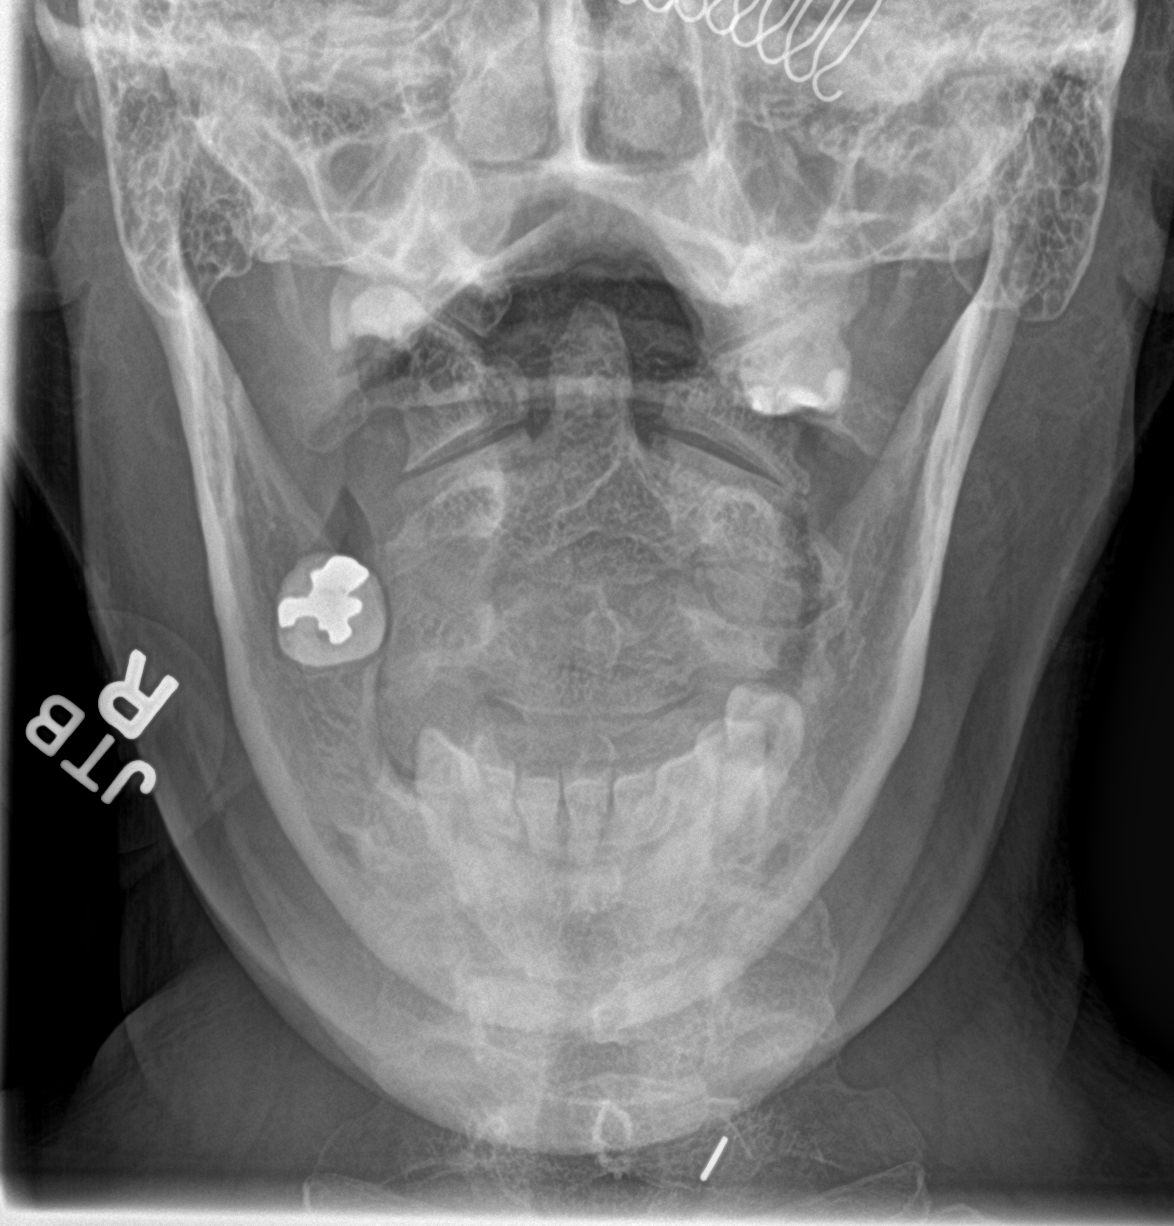

[5 of 5 positions shown; findings below may reference images not displayed]

FINDINGS: Seven cervical segments are again well visualized. Vertebral body
height is well maintained. Osteophytic changes are noted from C3-C7.
Disc space narrowing is noted at C4-5 and C5-6. Mild neural
foraminal narrowing is noted at these levels as well. No acute
fracture or acute facet abnormality is noted. The odontoid is within
normal limits.
IMPRESSION: Multilevel degenerative change without acute abnormality.

## 2017-06-24 ENCOUNTER — Ambulatory Visit: Payer: Self-pay | Admitting: Internal Medicine

## 2017-06-29 ENCOUNTER — Ambulatory Visit (INDEPENDENT_AMBULATORY_CARE_PROVIDER_SITE_OTHER): Payer: Self-pay | Admitting: Internal Medicine

## 2017-06-29 ENCOUNTER — Other Ambulatory Visit (INDEPENDENT_AMBULATORY_CARE_PROVIDER_SITE_OTHER): Payer: Self-pay

## 2017-06-29 ENCOUNTER — Encounter: Payer: Self-pay | Admitting: Internal Medicine

## 2017-06-29 VITALS — BP 126/76 | HR 84 | Ht 66.0 in | Wt 247.0 lb

## 2017-06-29 DIAGNOSIS — R935 Abnormal findings on diagnostic imaging of other abdominal regions, including retroperitoneum: Secondary | ICD-10-CM

## 2017-06-29 DIAGNOSIS — R109 Unspecified abdominal pain: Secondary | ICD-10-CM

## 2017-06-29 DIAGNOSIS — K8020 Calculus of gallbladder without cholecystitis without obstruction: Secondary | ICD-10-CM

## 2017-06-29 DIAGNOSIS — K219 Gastro-esophageal reflux disease without esophagitis: Secondary | ICD-10-CM

## 2017-06-29 DIAGNOSIS — R143 Flatulence: Secondary | ICD-10-CM

## 2017-06-29 LAB — H. PYLORI ANTIBODY, IGG: H Pylori IgG: NEGATIVE

## 2017-06-29 MED ORDER — OMEPRAZOLE 20 MG PO CPDR: 20.0000 mg | DELAYED_RELEASE_CAPSULE | Freq: Every day | ORAL | 6 refills | Status: DC

## 2017-06-29 MED ORDER — OMEPRAZOLE 20 MG PO CPDR: 20.0000 mg | DELAYED_RELEASE_CAPSULE | Freq: Every day | ORAL | 2 refills | Status: DC

## 2017-06-29 NOTE — Patient Instructions (Signed)
Your physician has requested that you go to the basement for the following lab work before leaving today: H Pylori antibody.  We have sent the following medications to your pharmacy for you to pick up at your convenience:  Omeprazole  Please follow up in 6 months.  You will be due for your next colonoscopy in 2025. We will contact you ahead of time to remind you to schedule the procedure.

## 2017-06-29 NOTE — Progress Notes (Signed)
HISTORY OF PRESENT ILLNESS:  Sally Wright is a pleasant 61 y.o. female with past medical history as listed below who is referred today by her primary care provider Dr. Mikey BussingHoffman with chief complaints of abdominal pain, bloating, alternating bowel habits, and GERD. Patient reports to me a prior history of ulcer disease. She has undergone previous colonoscopy and upper endoscopy in [place] and [place]. Her most recent history is that of one week of worsening abdominal pain for which she was evaluated in the emergency room 05/02/2017. That encounter, laboratories, and x-rays reviewed. Laboratories were remarkable for mild hypokalemia, elevated glucose. CBC was unremarkable. More recent laboratories remarkable for elevated TSH. CT scan of the abdomen and pelvis with and without contrast on 05/03/2017 revealed evidence of prior surgery with inflammatory changes of the small bowel most consistent with enteritis. No complicating features. Moderate stool in the colon and incidental gallstone noted. She was treated symptomatically and discharged. Advised to take laxatives which she did. Felt better in a few days. No recurrent problems since. Her surgical history is remarkable for hysterectomy which was complicated by what sounds like small intestinal injury requiring a second surgery. Limited outside records from [place] include a CT scan of the abdomen and pelvis July 2016 with no acute findings. A complete colonoscopy performed by Dr. Oren BracketFrederick Hardin 01/10/2014 revealed diverticulosis but was otherwise normal. The bowel preparation was of good quality and all landmarks identified. Routine follow-up in 10 years recommended. Active symptoms at this time include reflux disease as manifested by regurgitation and pyrosis. Also some belching and bloating. She reports that previous transient problems with alternating bowel habits have resolved. She currently describes one formed bowel movement per day which is her norm.  In terms of ulcers, her chief complaint then was pain. No bleeding or perforation. She does take Mobic for arthritis. She is not sure if she has been tested for Helicobacter pylori. No dysphagia.  REVIEW OF SYSTEMS:  All non-GI ROS negative unless otherwise stated in the history of present illness except for arthritis, back pain, visual change, depression, fatigue, headaches, muscle cramps, night sweats, shortness of breath, insomnia  Past Medical History:  Diagnosis Date  . Anxiety   . Depression   . Diabetes mellitus without complication (HCC) 1996  . GERD (gastroesophageal reflux disease)   . Hyperlipidemia   . Hypertension   . Thyroid disease     Past Surgical History:  Procedure Laterality Date  . ABDOMINAL HYSTERECTOMY  2009   Partial hysterectomy  . JOINT REPLACEMENT    . THYROIDECTOMY      Social History Sally Wright  reports that she has quit smoking. Her smoking use included Cigarettes. She has a 5.00 pack-year smoking history. She has never used smokeless tobacco. She reports that she does not drink alcohol or use drugs.  family history includes Breast cancer in her maternal aunt and sister; Colon cancer in her father; Goiter in her father and mother; Heart disease in her sister.  Allergies  Allergen Reactions  . Carvedilol     Tongue swelling       PHYSICAL EXAMINATION: Vital signs: BP 126/76   Pulse 84   Ht 5\' 6"  (1.676 m)   Wt 247 lb (112 kg)   BMI 39.87 kg/m   Constitutional: generally well-appearing, no acute distress Psychiatric: alert and oriented x3, cooperative Eyes: extraocular movements intact, anicteric, conjunctiva pink Mouth: oral pharynx moist, no lesions Neck: supple Lymph: no lymphadenopathy Cardiovascular: heart regular rate and rhythm, no  murmur Lungs: clear to auscultation bilaterally Abdomen: soft,Obese, nontender, nondistended, no obvious ascites, no peritoneal signs, normal bowel sounds, no organomegaly Rectal:  Omitted Extremities: no clubbing cyanosis or lower extremity edema bilaterally Skin: no lesions on visible extremities Neuro: No focal deficits. Cranial nerves intact. No asterixis.    ASSESSMENT:  #1. Acute abdominal pain. Recent emergency room visit for the same. Appears to have had infectious enteritis. Pain has resolved #2. Abdominal bloating. Nonspecific. No alarm features #3. GERD. Active symptoms. On no medical therapy #4. History of ulcer disease. Rule out H. pylori. She does take NSAIDs. #5. Gallstones on imaging. Incidental at this point #6. Colon cancer screening. Up-to-date. Last examination 2015   PLAN:  #1. Reflux precautions including weight loss #2. Prescribe omeprazole 20 mg daily. This for active GERD symptoms as well as upper GI mucosal protection in a patient with a history of ulcer disease who takes NSAIDs intermittently #3. Minimize NSAIDs #4. Check Helicobacter pylori antibody #5. Discussion on increased intestinal gas. As well, literature provided on gas as well as anti-gas and flatulence dietary brochure #6. Make colonoscopy recall for 2025. CMA instructed #7. Routine GI follow-up 6 months  A copy of this consultation note has been sent to Dr. Mikey Bussing  ADDENDUM Testing for Helicobacter pylori has returned negative. The patient has been notified

## 2017-06-30 ENCOUNTER — Encounter: Payer: Self-pay | Admitting: Internal Medicine

## 2017-07-14 ENCOUNTER — Ambulatory Visit (INDEPENDENT_AMBULATORY_CARE_PROVIDER_SITE_OTHER): Payer: Self-pay | Admitting: Internal Medicine

## 2017-07-14 VITALS — BP 153/75 | HR 74 | Temp 98.0°F | Ht 66.0 in | Wt 252.3 lb

## 2017-07-14 DIAGNOSIS — E079 Disorder of thyroid, unspecified: Secondary | ICD-10-CM

## 2017-07-14 DIAGNOSIS — Z87891 Personal history of nicotine dependence: Secondary | ICD-10-CM

## 2017-07-14 DIAGNOSIS — Z79899 Other long term (current) drug therapy: Secondary | ICD-10-CM

## 2017-07-14 DIAGNOSIS — H538 Other visual disturbances: Secondary | ICD-10-CM

## 2017-07-14 DIAGNOSIS — E119 Type 2 diabetes mellitus without complications: Secondary | ICD-10-CM

## 2017-07-14 DIAGNOSIS — F329 Major depressive disorder, single episode, unspecified: Secondary | ICD-10-CM

## 2017-07-14 DIAGNOSIS — I10 Essential (primary) hypertension: Secondary | ICD-10-CM

## 2017-07-14 MED ORDER — FLUOXETINE HCL 20 MG PO TABS: 20.0000 mg | ORAL_TABLET | Freq: Every day | ORAL | 1 refills | Status: DC

## 2017-07-14 MED ORDER — METFORMIN HCL ER 500 MG PO TB24: 500.0000 mg | ORAL_TABLET | Freq: Two times a day (BID) | ORAL | 1 refills | Status: DC

## 2017-07-14 MED ORDER — LEVOTHYROXINE SODIUM 137 MCG PO TABS: 137.0000 ug | ORAL_TABLET | Freq: Every day | ORAL | 1 refills | Status: DC

## 2017-07-14 MED ORDER — AMLODIPINE BESYLATE 10 MG PO TABS
10.0000 mg | ORAL_TABLET | Freq: Every day | ORAL | 1 refills | Status: DC
Start: 2017-07-14 — End: 2017-07-18

## 2017-07-14 MED ORDER — LOSARTAN POTASSIUM-HCTZ 100-25 MG PO TABS: 1.0000 | ORAL_TABLET | Freq: Every day | ORAL | 1 refills | Status: DC

## 2017-07-14 NOTE — Progress Notes (Signed)
   CC: follow-up of essential hypertension  HPI:  [name] is a 61 y.o. female with history noted below that presents to the internal medicine clinic for follow-up on essential hypertension.  Please see problem based charting for the status of patient's chronic medical conditions.  Past Medical History:  Diagnosis Date  . Anxiety   . Depression   . Diabetes mellitus without complication (HCC) 1996  . GERD (gastroesophageal reflux disease)   . Hyperlipidemia   . Hypertension   . Thyroid disease     Review of Systems:  Review of Systems  Eyes: Positive for blurred vision.  Respiratory: Negative for shortness of breath.   Cardiovascular: Negative for chest pain.  Gastrointestinal: Negative for nausea and vomiting.  Neurological: Negative for dizziness and headaches.     Physical Exam:  Vitals:   07/14/17 1418  BP: (!) 153/75  Pulse: 74  Temp: 98 F (36.7 C)  TempSrc: Oral  SpO2: 99%  Weight: 252 lb 4.8 oz (114.4 kg)  Height:  (1.676 m)   Physical Exam  Constitutional: She is well-developed, well-nourished, and in no distress.  Eyes: Pupils are equal, round, and reactive to light. Conjunctivae and EOM are normal. Right eye exhibits no discharge. Left eye exhibits no discharge. No scleral icterus.  Cardiovascular: Normal rate, regular rhythm and normal heart sounds.  Exam reveals no gallop and no friction rub.   No murmur heard. Pulmonary/Chest: Effort normal and breath sounds normal. No respiratory distress. She has no wheezes. She has no rales.  Musculoskeletal: She exhibits no edema.    Assessment & Plan:   See encounters tab for problem based medical decision making.    Patient discussed with Dr. Oswaldo Done

## 2017-07-14 NOTE — Patient Instructions (Signed)
[name],  He was a pleasure seeing you today. Refills of your medications for 2 weeks have been made to her pharmacy. An ophthalmology referral has been made. Please follow up in 2 months.

## 2017-07-15 NOTE — Assessment & Plan Note (Signed)
Assessment: Blurry vision  She states she saw an optometrist in July 2018 and got new glassess however, has not helped with blurry vision.  Will refer to opthalmology.  Plan -opthamology referral

## 2017-07-15 NOTE — Assessment & Plan Note (Signed)
Assessment: Essential hypertension  Clinic visit 05/29/2017 patient's blood pressure continued to be elevated at 190/91 and on repeat was 181/86. At that time eplerenone 50 mg was added and metoprolol was discontinued due to patient report of side effect (felt ill).Patient is currently prescribed losartan-HCTZ 100-25, amlodipine 10 mg and Eplerenone 50 mg, however states that she has not received her medications from the med assist program for the past 2 weeks.  She reports she was able to get in contact with the program and her medication was being delivered tomorrow.  Her blood pressure today is 153/75 despite not taking blood pressure medications.  At her GI visit on 06/29/2017 when she was taking her blood pressure medication her BP was 126/76. I believe now she is on the appropriate BP med regimen.     Plan - 2 week supply of blood pressure , thyroid, diabetes and depression medications by the time her medication arrives by mail -will need bmet at next visit when on medications

## 2017-07-18 ENCOUNTER — Telehealth: Payer: Self-pay | Admitting: Pharmacist

## 2017-07-18 NOTE — Progress Notes (Signed)
Internal Medicine Clinic Attending  Case discussed with Dr. Hoffman at the time of the visit.  We reviewed the resident's history and exam and pertinent patient test results.  I agree with the assessment, diagnosis, and plan of care documented in the resident's note.  

## 2017-07-18 NOTE — Progress Notes (Signed)
Patient called and left message to call back for help with her medications.  Contacted patient today and she states medication issue was resolved. She has no further concerns today.

## 2017-07-19 NOTE — Addendum Note (Signed)
Addended by: Mliss Fritz on: 07/19/2017 03:12 PM   Modules accepted: Orders

## 2017-08-11 ENCOUNTER — Encounter: Payer: Self-pay | Admitting: Internal Medicine

## 2017-09-20 ENCOUNTER — Other Ambulatory Visit: Payer: Self-pay | Admitting: Internal Medicine

## 2017-09-22 ENCOUNTER — Encounter: Payer: Self-pay | Admitting: Internal Medicine

## 2017-12-01 ENCOUNTER — Encounter: Payer: Self-pay | Admitting: Internal Medicine

## 2018-06-21 ENCOUNTER — Other Ambulatory Visit: Payer: Self-pay | Admitting: Pharmacist

## 2018-06-21 DIAGNOSIS — E118 Type 2 diabetes mellitus with unspecified complications: Secondary | ICD-10-CM

## 2018-06-21 MED ORDER — AMLODIPINE-ATORVASTATIN 10-40 MG PO TABS: 1.0000 | ORAL_TABLET | Freq: Every day | ORAL | 3 refills | Status: DC

## 2019-04-23 ENCOUNTER — Encounter: Payer: Self-pay | Admitting: *Deleted

## 2020-03-05 HISTORY — PX: EYE MUSCLE SURGERY: SHX370

## 2020-09-01 ENCOUNTER — Other Ambulatory Visit: Payer: Self-pay

## 2020-09-01 ENCOUNTER — Ambulatory Visit (INDEPENDENT_AMBULATORY_CARE_PROVIDER_SITE_OTHER): Payer: Medicare Other | Admitting: Internal Medicine

## 2020-09-01 ENCOUNTER — Encounter: Payer: Self-pay | Admitting: Internal Medicine

## 2020-09-01 VITALS — BP 137/89 | HR 88 | Temp 98.2°F | Ht 66.0 in | Wt 261.9 lb

## 2020-09-01 DIAGNOSIS — E119 Type 2 diabetes mellitus without complications: Secondary | ICD-10-CM

## 2020-09-01 DIAGNOSIS — Z8711 Personal history of peptic ulcer disease: Secondary | ICD-10-CM

## 2020-09-01 DIAGNOSIS — M25512 Pain in left shoulder: Secondary | ICD-10-CM | POA: Diagnosis not present

## 2020-09-01 DIAGNOSIS — E785 Hyperlipidemia, unspecified: Secondary | ICD-10-CM | POA: Diagnosis not present

## 2020-09-01 DIAGNOSIS — M25112 Fistula, left shoulder: Secondary | ICD-10-CM | POA: Diagnosis not present

## 2020-09-01 DIAGNOSIS — M25519 Pain in unspecified shoulder: Secondary | ICD-10-CM | POA: Insufficient documentation

## 2020-09-01 DIAGNOSIS — R0602 Shortness of breath: Secondary | ICD-10-CM

## 2020-09-01 DIAGNOSIS — Z23 Encounter for immunization: Secondary | ICD-10-CM | POA: Diagnosis not present

## 2020-09-01 DIAGNOSIS — M7582 Other shoulder lesions, left shoulder: Secondary | ICD-10-CM | POA: Insufficient documentation

## 2020-09-01 DIAGNOSIS — I10 Essential (primary) hypertension: Secondary | ICD-10-CM

## 2020-09-01 DIAGNOSIS — G4733 Obstructive sleep apnea (adult) (pediatric): Secondary | ICD-10-CM | POA: Diagnosis not present

## 2020-09-01 DIAGNOSIS — E1169 Type 2 diabetes mellitus with other specified complication: Secondary | ICD-10-CM | POA: Diagnosis not present

## 2020-09-01 DIAGNOSIS — Z0000 Encounter for general adult medical examination without abnormal findings: Secondary | ICD-10-CM

## 2020-09-01 DIAGNOSIS — E890 Postprocedural hypothyroidism: Secondary | ICD-10-CM

## 2020-09-01 DIAGNOSIS — Z0001 Encounter for general adult medical examination with abnormal findings: Secondary | ICD-10-CM | POA: Diagnosis present

## 2020-09-01 DIAGNOSIS — Z8709 Personal history of other diseases of the respiratory system: Secondary | ICD-10-CM | POA: Insufficient documentation

## 2020-09-01 DIAGNOSIS — G8929 Other chronic pain: Secondary | ICD-10-CM | POA: Diagnosis not present

## 2020-09-01 LAB — GLUCOSE, CAPILLARY: Glucose-Capillary: 210 mg/dL — ABNORMAL HIGH (ref 70–99)

## 2020-09-01 LAB — POCT GLYCOSYLATED HEMOGLOBIN (HGB A1C): Hemoglobin A1C: 8.9 % — AB (ref 4.0–5.6)

## 2020-09-01 MED ORDER — OMEPRAZOLE 20 MG PO CPDR: 20.0000 mg | DELAYED_RELEASE_CAPSULE | Freq: Every day | ORAL | 2 refills | Status: DC

## 2020-09-01 MED ORDER — LEVOTHYROXINE SODIUM 137 MCG PO TABS: 137.0000 ug | ORAL_TABLET | Freq: Every day | ORAL | 5 refills | Status: DC

## 2020-09-01 MED ORDER — TRIAMTERENE-HCTZ 37.5-25 MG PO TABS: 1.0000 | ORAL_TABLET | Freq: Every day | ORAL | 11 refills | Status: DC

## 2020-09-01 MED ORDER — AMLODIPINE BESYLATE 10 MG PO TABS: 10.0000 mg | ORAL_TABLET | Freq: Every day | ORAL | 5 refills | Status: DC

## 2020-09-01 NOTE — Assessment & Plan Note (Signed)
Patient reports having a history of obstructive sleep apnea is currently on CPAP machine, she has sleep study documents with her that she will decrease in oxygen saturation to mid 60-70s while sleeping.  She reports that her CPAP machine pressure was adjusted from 4 to 12 and feels that it is blowing too much air and causing her to have significant dry mouth at night.  She currently is on nasal mask instead of the full facemask.  We will need to obtain documents from her prior PCP office.  She will likely need a titration sleep study for her CPAP machine.  Will refer to pulmonology for evaluation and adjustment of CPAP machine.

## 2020-09-01 NOTE — Patient Instructions (Signed)
[name],  It was a pleasure to see you today. Thank you for coming in.   Today we discussed your blood pressure. In regards to this please continue taking your current medications, your blood pressure looks good today.    We also discussed your diabetes. I am checking an A1c today, we may need to start you on a different medication if this is elevated.   We also discussed your shoulder pain, you can continue start using voltaren gel on the area to see if this help. We discussed physical therapy, if your pain is still present on your next visit then we can consider referring you to get this.   We also discussed your sleep apnea and shortness of breath. I have referred you to pulmonology for further evaluation.    Please return to clinic in 3 months or sooner if needed.   Thank you again for coming in.   [name]

## 2020-09-01 NOTE — Assessment & Plan Note (Signed)
Patient reports having some left shoulder pain, states that she had a fall in May 2021 where she tripped and fell into the couch.  She did not get any imaging or testing done at that time. She reports that the pain is worse with lifting her arm and with any movement. She has been taking tylenol with some improvement. Denies weakness, numbness, tingling, warmth, swelling, or redness. On exam she has mild TTP over the anterior left shoulder, no weakness on extension or flexion, no warmth, redness, swelling, or rash noted. Pain has persisted for approximately 6 months following trauma, no weakness or limited ROM noted on exam. Differential includes rotator cuff tendinopathy, acromioclavicular injury/sprain, or osteoarthritis, since she does not have weakness or limited ROM then rotator cuff tear or adhesive capsulitis seem less likely. We discussed physical therapy referral and patient declined this for now. Discussed that she can use over the counter voltaren gel on the area. If symptoms persist or worsen would recommend trial of PT.

## 2020-09-01 NOTE — Progress Notes (Signed)
   CC: Establish care for HTN, DM, OSA, bronchitis, and left shoulder pain  HPI:  Sally Wright is a 64 y.o. with the history listed below presenting to establish care for HTN, DM, OSA, bronchitis, and left shoulder pain.   Past Medical History:  Diagnosis Date  . Anxiety   . Depression   . Diabetes mellitus without complication (HCC) 1996  . GERD (gastroesophageal reflux disease)   . Hyperlipidemia   . Hypertension   . Thyroid disease    Review of Systems:   Constitutional: Negative for chills and fever.  Respiratory: Negative for shortness of breath.   Cardiovascular: Negative for chest pain and leg swelling.  Gastrointestinal: Negative for abdominal pain, nausea and vomiting.  Neurological: Negative for dizziness and headaches.   Physical Exam:  Vitals:   09/01/20 1033  BP: (!) 155/83  Pulse: 91  Temp: 98.2 F (36.8 C)  TempSrc: Oral  SpO2: 98%  Weight: 261 lb 14.4 oz (118.8 kg)  Height: 5\' 6"  (1.676 m)   Physical Exam Constitutional:      Appearance: Normal appearance. She is obese.  HENT:     Head: Normocephalic and atraumatic.     Nose: Nose normal.     Mouth/Throat:     Mouth: Mucous membranes are moist.     Pharynx: Oropharynx is clear.  Eyes:     Extraocular Movements: Extraocular movements intact.     Conjunctiva/sclera: Conjunctivae normal.     Pupils: Pupils are equal, round, and reactive to light.  Cardiovascular:     Rate and Rhythm: Normal rate and regular rhythm.     Pulses: Normal pulses.     Heart sounds: Normal heart sounds.  Pulmonary:     Effort: Pulmonary effort is normal.     Breath sounds: Normal breath sounds.  Abdominal:     General: Abdomen is flat. Bowel sounds are normal.     Palpations: Abdomen is soft.  Musculoskeletal:        General: Tenderness (Mild TTP over left anterior shoulder) present. No swelling, deformity or signs of injury. Normal range of motion.     Cervical back: Normal range of motion and neck supple.      Right lower leg: No edema.     Left lower leg: No edema.  Skin:    General: Skin is warm and dry.     Capillary Refill: Capillary refill takes less than 2 seconds.  Neurological:     General: No focal deficit present.     Mental Status: She is alert and oriented to person, place, and time.  Psychiatric:        Mood and Affect: Mood normal.        Behavior: Behavior normal.      Assessment & Plan:   See Encounters Tab for problem based charting.  Patient discussed with Dr. 

## 2020-09-01 NOTE — Assessment & Plan Note (Signed)
Patient is currently on amlodipine 10 mg daily, and triamterene-HCTZ 37.5-25 mg daily.  Denies any issues taking her medications.  Denies any chest pain, lightheadedness, dizziness, headache, or shortness of breath.  Blood pressure today is 137/84.  We will obtain records from prior PCP office. -Check CMP today -Continue amlodipine 10 mg daily -Continue triamterene-HCTZ 37.5--25 mg daily

## 2020-09-01 NOTE — Assessment & Plan Note (Signed)
Patient reports having a history of bronchitis approximately 2 years ago, she had been given a nebulizer of ipratropium and albuterol which she reports helped with her symptoms at that time.  She reports that she has been having occasional shortness of breath with minimal exertion, gets short of breath walking from her house to the garage.  She also has a mild productive cough on most mornings.  She denies any chest pain, shortness of breath at rest, lightheadedness, dizziness, fatigue, weakness, numbness.  On exam her lungs are CTA BL, no wheezing, rhonchi, or rales noted, cardiac exam unremarkable.  Patient is being referred to pulmonology for titration of CPAP machine.  Could consider obtaining PFTs to evaluate shortness of breath.

## 2020-09-01 NOTE — Assessment & Plan Note (Addendum)
Patient had previously been on Metformin 500 mg daily, states that she has not been taking it for few months now because she started having some lip swelling she took the Metformin.  She does not check her CBGs at home.  She also reports not adhering to low-carb diet or monitoring her food intake.  Does endorse some polyuria and nocturia, denies any polydipsia.  Does not know what her last A1c was.  We we will obtain records from prior PCP office, repeat A1c today, and hold off on starting any medications at this time.  Discussed that if A1c is elevated he may need additional medication such as an SGLT2 inhibitor. -A1c today -Remove Metformin from medication list  Addendum: A1c elevated at 8.9. Contacted patient and discussed results. Tried to clarify the metformin reaction, she reports that she feels like she has been on it for so long and states that she just didn't want to take it anymore, she was concerned about her kidneys. She states that she did have some tongue swelling after taking the medications but was not sure if it was related. We discussed that her kidney function is fine when we checked it. She stated she did not want to be placed back on metformin. Discussed starting Januvia for better blood sugar control. She stated that she did not want to start anything at this time, just wanted to work on her diet and exercise for now. Advised of risks of untreated diabetes.  -Consider Januvia on next visit -Could consider metformin if patient was willing

## 2020-09-01 NOTE — Assessment & Plan Note (Addendum)
Not on any statin at this time. Will check lipid panel to evaluate ASCVD risk.   Addendum: Total cholesterol 202, HDL 56, LDL 127.  10-year ASCVD risk is 27%, however patient is 65 and above studied window.  Patient likely still benefit from high intensity statin.  Contacted patient, she states that while she was in [place] she developed leg pain that is related to her statin.  We discussed additional medication such as Zetia, patient reports that she does not want to start any medications at this time.  We can continue to discuss at her follow-up visit.

## 2020-09-01 NOTE — Assessment & Plan Note (Addendum)
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  She is currently on 137 mcg of Synthroid daily.  Denies any issues taking her medications, states that her last TSH was stable.  She does endorse some mild skin dryness, no significant weight changes, denies heat or cold intolerance, appetite changes, or other issues. -Continue Synthroid 137 mcg daily -Check TSH today  Addendum: TSH 0.288. Contacted patient and informed to decrease synthroid to 125 mcg daily. RTC in 6 weeks for repeat TSH.

## 2020-09-02 LAB — CMP14 + ANION GAP
ALT: 17 IU/L (ref 0–32)
AST: 16 IU/L (ref 0–40)
Albumin/Globulin Ratio: 1.3 (ref 1.2–2.2)
Albumin: 4.4 g/dL (ref 3.8–4.8)
Alkaline Phosphatase: 147 IU/L — ABNORMAL HIGH (ref 44–121)
Anion Gap: 14 mmol/L (ref 10.0–18.0)
BUN/Creatinine Ratio: 15 (ref 12–28)
BUN: 12 mg/dL (ref 8–27)
Bilirubin Total: 0.4 mg/dL (ref 0.0–1.2)
CO2: 25 mmol/L (ref 20–29)
Calcium: 9.8 mg/dL (ref 8.7–10.3)
Chloride: 99 mmol/L (ref 96–106)
Creatinine, Ser: 0.82 mg/dL (ref 0.57–1.00)
GFR calc Af Amer: 87 mL/min/{1.73_m2} (ref >59)
GFR calc non Af Amer: 75 mL/min/{1.73_m2} (ref >59)
Globulin, Total: 3.4 g/dL (ref 1.5–4.5)
Glucose: 229 mg/dL — ABNORMAL HIGH (ref 65–99)
Potassium: 3.8 mmol/L (ref 3.5–5.2)
Sodium: 138 mmol/L (ref 134–144)
Total Protein: 7.8 g/dL (ref 6.0–8.5)

## 2020-09-02 LAB — TSH: TSH: 0.288 u[IU]/mL — ABNORMAL LOW (ref 0.450–4.500)

## 2020-09-02 LAB — LIPID PANEL
Chol/HDL Ratio: 3.6 ratio (ref 0.0–4.4)
Cholesterol, Total: 202 mg/dL — ABNORMAL HIGH (ref 100–199)
HDL: 56 mg/dL (ref >39)
LDL Chol Calc (NIH): 127 mg/dL — ABNORMAL HIGH (ref 0–99)
Triglycerides: 106 mg/dL (ref 0–149)
VLDL Cholesterol Cal: 19 mg/dL (ref 5–40)

## 2020-09-02 MED ORDER — LEVOTHYROXINE SODIUM 125 MCG PO TABS: 125.0000 ug | ORAL_TABLET | Freq: Every day | ORAL | 2 refills | Status: DC

## 2020-09-02 NOTE — Addendum Note (Signed)
Addended by: Claudean Severance on: 09/02/2020 03:30 PM   Modules accepted: Orders

## 2020-09-02 NOTE — Progress Notes (Signed)
Internal Medicine Clinic Attending  Case discussed with Dr. Krienke at the time of the visit.  We reviewed the resident's history and exam and pertinent patient test results.  I agree with the assessment, diagnosis, and plan of care documented in the resident's note.  Kayode Petion, M.D., Ph.D.  

## 2020-09-08 LAB — HM DIABETES EYE EXAM

## 2020-09-24 ENCOUNTER — Telehealth: Payer: Self-pay | Admitting: Internal Medicine

## 2020-09-24 NOTE — Telephone Encounter (Signed)
Pt requesting a call back.  Patient still having shoulder pain and is requesting to have some imaging done rather that PT.  Please call patient back.

## 2020-09-24 NOTE — Telephone Encounter (Signed)
appt 1045 dr Cleaster Corin

## 2020-09-25 ENCOUNTER — Ambulatory Visit (INDEPENDENT_AMBULATORY_CARE_PROVIDER_SITE_OTHER): Payer: Medicare Other | Admitting: Internal Medicine

## 2020-09-25 ENCOUNTER — Other Ambulatory Visit: Payer: Self-pay

## 2020-09-25 ENCOUNTER — Ambulatory Visit (HOSPITAL_COMMUNITY)
Admission: RE | Admit: 2020-09-25 | Discharge: 2020-09-25 | Disposition: A | Discharge status: Home / Self Care | Payer: Medicare Other | Source: Ambulatory Visit | Attending: Internal Medicine | Admitting: Internal Medicine

## 2020-09-25 VITALS — BP 140/72 | HR 96 | Temp 99.0°F | Wt 256.4 lb

## 2020-09-25 DIAGNOSIS — W19XXXS Unspecified fall, sequela: Secondary | ICD-10-CM | POA: Diagnosis not present

## 2020-09-25 DIAGNOSIS — G8929 Other chronic pain: Secondary | ICD-10-CM

## 2020-09-25 DIAGNOSIS — S4992XS Unspecified injury of left shoulder and upper arm, sequela: Secondary | ICD-10-CM | POA: Diagnosis not present

## 2020-09-25 DIAGNOSIS — E039 Hypothyroidism, unspecified: Secondary | ICD-10-CM

## 2020-09-25 DIAGNOSIS — M25512 Pain in left shoulder: Secondary | ICD-10-CM

## 2020-09-25 DIAGNOSIS — R748 Abnormal levels of other serum enzymes: Secondary | ICD-10-CM

## 2020-09-25 NOTE — Progress Notes (Signed)
   CC: left shoulder pain  HPI:  [name] is a 64 y.o. with PMH as below.   Please see A&P for assessment of the patient's acute and chronic medical conditions.   Larey Seat on her shoulder in March and has been having pain since then that is controlled with tylenol but she would like to stop taking this.Insistent on shoulder xr. She was previously told that injury was likely rotator cuff injury and prescribed  PT but does not currently have a car to go easily and would like to have a more definitive diagnosis.   Past Medical History:  Diagnosis Date  . Anxiety   . Depression   . Diabetes mellitus without complication (HCC) 1996  . GERD (gastroesophageal reflux disease)   . Hyperlipidemia   . Hypertension   . Thyroid disease    Review of Systems:   Review of Systems  Constitutional: Negative for diaphoresis, fever and malaise/fatigue.  Cardiovascular: Negative for chest pain, palpitations and leg swelling.  Gastrointestinal: Negative for nausea.  Musculoskeletal: Positive for falls, joint pain and myalgias. Negative for back pain and neck pain.  Neurological: Negative for tingling, tremors and sensory change.   Physical Exam:   Constitution: NAD, appears stated age Cardio: RRR, no m/r/g, no LE edema  Respiratory: non-labored breathing, on room air MSK:  LUE: Pain worse with arm extension, TTP over proximal biceps tendon and has pain with resistance to internal rotation and empty can test but no weakness with resistance in all ROM, has  full active ROM.  Neuro: normal affect, a&ox3 GU: Skin: c/d/i    Vitals:   09/25/20 1033  BP: 140/72  Pulse: 96  Temp: 99 F (37.2 C)  TempSrc: Oral  SpO2: 99%  Weight: 256 lb 6.4 oz (116.3 kg)    Assessment & Plan:   See Encounters Tab for problem based charting.  Patient discussed with Dr. Sandre Kitty

## 2020-09-25 NOTE — Patient Instructions (Signed)
Thank you for allowing [place] to provide your care today. Today we discussed your left shoulder pain    Please continue to take tylenol as needed for your pain. Ice the area as well and try to rest it.     I have placed an order for a referral to Sports Medicine. They will call you for an appointment.   Should you have any questions or concerns please call the internal medicine clinic at [phone].

## 2020-09-26 ENCOUNTER — Encounter: Payer: Self-pay | Admitting: Internal Medicine

## 2020-09-26 ENCOUNTER — Encounter (HOSPITAL_COMMUNITY): Payer: Self-pay | Admitting: *Deleted

## 2020-09-26 ENCOUNTER — Ambulatory Visit (INDEPENDENT_AMBULATORY_CARE_PROVIDER_SITE_OTHER): Payer: Medicare Other | Admitting: Family Medicine

## 2020-09-26 ENCOUNTER — Other Ambulatory Visit: Payer: Self-pay

## 2020-09-26 ENCOUNTER — Emergency Department (HOSPITAL_COMMUNITY)
Admission: EM | Admit: 2020-09-26 | Discharge: 2020-09-27 | Disposition: A | Discharge status: Home / Self Care | Payer: Medicare Other | Attending: Emergency Medicine | Admitting: Emergency Medicine

## 2020-09-26 VITALS — BP 122/78 | Ht 66.0 in | Wt 256.0 lb

## 2020-09-26 DIAGNOSIS — Z5321 Procedure and treatment not carried out due to patient leaving prior to being seen by health care provider: Secondary | ICD-10-CM | POA: Insufficient documentation

## 2020-09-26 DIAGNOSIS — R739 Hyperglycemia, unspecified: Secondary | ICD-10-CM | POA: Diagnosis not present

## 2020-09-26 DIAGNOSIS — M7582 Other shoulder lesions, left shoulder: Secondary | ICD-10-CM | POA: Diagnosis not present

## 2020-09-26 DIAGNOSIS — R0602 Shortness of breath: Secondary | ICD-10-CM | POA: Insufficient documentation

## 2020-09-26 LAB — CBG MONITORING, ED: Glucose-Capillary: 350 mg/dL — ABNORMAL HIGH (ref 70–99)

## 2020-09-26 MED ORDER — METHYLPREDNISOLONE ACETATE 40 MG/ML IJ SUSP
40.0000 mg | Freq: Once | INTRAMUSCULAR | Status: AC
  Administered 2020-09-26: 40 mg via INTRA_ARTICULAR

## 2020-09-26 NOTE — Progress Notes (Addendum)
SUBJECTIVE:   CHIEF COMPLAINT / HPI:   Left shoulder pain: she fell on her left shoulder at the end of march.  She hit it again in July on a doorway.  In march, she tripped over the rug and landed on the couch arm. Did not have immediate severe pain or impairment. Pain became worse after she stopped taking tylenol in september.  She had been taking tylenol for 20 years for knee pain.  She stopped in September of this year because she was tired of taking the medicine.  The pain wakes her up at night sometimes. No night sweats, no fevers.     She uses a topical rub that contains THC which she got from her daughter in [place].  She applies heat to it as well.  Reaching overhead or reaching backwards is painful. carrying things in that hand also makes the pain worse.  Denies decreased range of motion.  Pain was at a 5/10 this morning.  Pain worsens at night when she is relaxed.  Her bra where it touches her shoulder causes pain.  The Pain doesn't radiate to the neck.  She sometimes gets numbness in fingers of left hand as well as right foot. She states she has diabetes and attributes it to her diabetes.      PERTINENT  PMH / PSH: osteoarthritis   OBJECTIVE:   BP 122/78   Ht 5\' 6"  (1.676 m)   Wt 256 lb (116.1 kg)   BMI 41.32 kg/m   Gen: alert, oriented.  No acute distress.  Appears stated age.   MSK: mildly tender to palpation of the left anterior shoulder.  Active range of motion of the left shoulder is normal in flexion, extension, abduction, abduction, internal and external rotation.  Grip strength 5/5 b/l.  5/5 strength with flexion/extension of arms.  Some mild left sided weakness and pain with empty can test.   Special tests: speed's test positive on left.  Pain with forced external shoulder rotation.  Hawkins negative. Cross arm test negative.  Equivocal Neer test.  Complete MSK ultrasound: Left shoulder Biceps tendon: well visualized in both transverse and long axis with no effusion  surrounding the tendon and no disruption of the tendon fibers. Pec major tendon: well visualized and inserting at the greater head of the humerus Subscapularis: well visualized with no tears, no fiber disruption, slight hypoechoic collection superior to subscapularis AC joint: well visualized, no giser sign present Infraspinatus: well visualized with no disruption of the fibers, no hypoechoic changes Supraspinatus: well visualized with no tears or fiber disruption, mild hypoechoic collection superior to supraspinatus Posterior glenohumeral joint: well visualized Interpretation: No evidence of complete tear of the rotator cuff muscles or tendons no signs of partial tear.  She does have hypoechoic collections over the subscap and supraspinatus indicating bursitis of the shoulder.  No impingement seen on dynamic testing.  Most likely subacromial bursitis.   ASSESSMENT/PLAN:   Rotator cuff tendonitis, left Pain with speed's test, forced external rotation.  Pain and mild weakness with empty can test.  Ultrasound imaging showed some inflammation of the supraspinatus and some subacromial bursitis.  No tears appreciated.   - steroidal injection performed today in clinic - home rotator cuff exercises - recommended voltaren gel  - follow up in 6 weeks    Procedure Verbal consent was obtained after discussing the risks and benefits of the procedure with the patient. A timeout was performed and the correct site and side was identified and  confirmed.  The left posterior shoulder was cleaned in sterile fashion betadine and alcohol pad. 80 mg Depo-medrol and 3 cc 1% Lidocaine was injected using a posteriolateral approach using a 5 cc syringe and 25 gauge 1 and 1/2 in needle. No complications were encountered. Minimal blood loss. A band aid was applied.    Sandre Kitty, MD [facility]   Resident Attestation   I saw and evaluated the patient, performing the key elements of the  service.I  personally performed or re-performed the history, physical exam, and medical decision making activities of this service and have verified that the service and findings are accurately documented in the resident's note. I developed the management plan that is described in the resident's note, and I agree with the content, with my edits above.  Jules Schick, DO [facility], PGY-4  I was the preceptor for this visit and available for immediate consultation Marsa Aris, DO

## 2020-09-26 NOTE — Patient Instructions (Signed)
It was great to meet you today! Thank you for letting me participate in your care!  Today, we discussed your left shoulder pain which is due to subacromial bursitis. I gave you an injection of a corticosteroid so be sure to go home and check your blood sugar and ensure it is not too high. If it gets over 400 please seek immediate care.  Please do the exercises we showed you 3 times per week as directed. I will see you back in 6 weeks if you are not better however if you are completely better you can call and cancel! It was a pleasure taking care of you today.  Be well, Jules Schick, DO PGY-4, Sports Medicine Fellow [facility]

## 2020-09-26 NOTE — ED Triage Notes (Signed)
Pt says that she felt off balance and had some shortness of breath, she laid down to rest, when she woke up her sugar was in the 500's. Took a metformin for the same. Says she feels much better now. States she had a cortisone shot for tendonitis this morning.

## 2020-09-26 NOTE — Assessment & Plan Note (Signed)
Pain with speed's test, forced external rotation.  Pain and mild weakness with empty can test.  Ultrasound imaging showed some inflammation of the supraspinatus and some subacromial bursitis.  No tears appreciated.   - steroidal injection performed today in clinic - home rotator cuff exercises - recommended voltaren gel  - follow up in 6 weeks

## 2020-09-27 DIAGNOSIS — M25512 Pain in left shoulder: Secondary | ICD-10-CM | POA: Insufficient documentation

## 2020-09-27 LAB — URINALYSIS, ROUTINE W REFLEX MICROSCOPIC
Bacteria, UA: NONE SEEN
Bilirubin Urine: NEGATIVE
Glucose, UA: >=500 mg/dL — AB
Hgb urine dipstick: NEGATIVE
Ketones, ur: NEGATIVE mg/dL
Leukocytes,Ua: NEGATIVE
Nitrite: NEGATIVE
Protein, ur: NEGATIVE mg/dL
Specific Gravity, Urine: 1.021 (ref 1.005–1.030)
pH: 5 (ref 5.0–8.0)

## 2020-09-27 LAB — BASIC METABOLIC PANEL
Anion gap: 12 (ref 5–15)
BUN: 15 mg/dL (ref 8–23)
CO2: 24 mmol/L (ref 22–32)
Calcium: 9.4 mg/dL (ref 8.9–10.3)
Chloride: 95 mmol/L — ABNORMAL LOW (ref 98–111)
Creatinine, Ser: 0.9 mg/dL (ref 0.44–1.00)
GFR, Estimated: >60 mL/min (ref >60)
Glucose, Bld: 350 mg/dL — ABNORMAL HIGH (ref 70–99)
Potassium: 3.9 mmol/L (ref 3.5–5.1)
Sodium: 131 mmol/L — ABNORMAL LOW (ref 135–145)

## 2020-09-27 LAB — CBC
HCT: 37.4 % (ref 36.0–46.0)
Hemoglobin: 12.6 g/dL (ref 12.0–15.0)
MCH: 30.9 pg (ref 26.0–34.0)
MCHC: 33.7 g/dL (ref 30.0–36.0)
MCV: 91.7 fL (ref 80.0–100.0)
Platelets: 518 10*3/uL — ABNORMAL HIGH (ref 150–400)
RBC: 4.08 MIL/uL (ref 3.87–5.11)
RDW: 12.9 % (ref 11.5–15.5)
WBC: 9.4 10*3/uL (ref 4.0–10.5)
nRBC: 0 % (ref 0.0–0.2)

## 2020-09-27 NOTE — ED Notes (Signed)
Pt leaving AMA, advised to return is symptoms worsen.

## 2020-09-27 NOTE — Assessment & Plan Note (Addendum)
[place] on her shoulder in March and has been having pain since then that is controlled with tylenol but she would like to stop taking this.Insistent on shoulder xr. She was previously told that injury was likely rotator cuff injury and prescribed  PT but does not currently have a car to go easily and would like to have a more definitive diagnosis. Pain worse with extension, TTP over proximal biceps tendon and has pain with resistance to internal rotation, empty can test, and yerguson's but no weakness with resistance in all ROM, has  full active ROM.  Discussed that injury is likely biceps tendinitis or rotator cuff injury and that she will likely benefit from PT.   - recommended resting the shoulder and not lifting anything heavy to facility healing - xr left shoulder - refer to sports medicine  - advised that she could continue to use tylenol prn, alk phos previously elevated, repeat CMP today

## 2020-09-29 ENCOUNTER — Encounter: Payer: Self-pay | Admitting: *Deleted

## 2020-09-30 ENCOUNTER — Other Ambulatory Visit: Payer: Self-pay | Admitting: *Deleted

## 2020-09-30 ENCOUNTER — Telehealth: Payer: Self-pay

## 2020-09-30 DIAGNOSIS — E1169 Type 2 diabetes mellitus with other specified complication: Secondary | ICD-10-CM

## 2020-09-30 MED ORDER — METFORMIN HCL 500 MG PO TABS: 500.0000 mg | ORAL_TABLET | Freq: Every day | ORAL | 11 refills | Status: DC

## 2020-09-30 MED ORDER — METFORMIN HCL 500 MG PO TABS: 500.0000 mg | ORAL_TABLET | Freq: Every day | ORAL | 1 refills | Status: DC

## 2020-09-30 NOTE — Telephone Encounter (Signed)
Please see office note from 09/01/2020 r/t metformin and advise. Thank you, SChaplin, RN,BSN

## 2020-09-30 NOTE — Addendum Note (Signed)
Addended by: Guinevere Scarlet A on: 09/30/2020 11:28 AM   Modules accepted: Orders

## 2020-09-30 NOTE — Telephone Encounter (Signed)
Reviewed chart. Refill appropriate and ordered. Her reaction would not have been related to the metformin.

## 2020-09-30 NOTE — Assessment & Plan Note (Signed)
Patient is interested in restarting metformin again.   - f/u for a1c in two months.  - metformin 500 mg qd reordered

## 2020-09-30 NOTE — Telephone Encounter (Signed)
Need refill on metformin ;pt contact [phone]   [facility] - [place], [place] - [address] AT Portal to Registered [employer] Sites

## 2020-09-30 NOTE — Addendum Note (Signed)
Addended by: Guinevere Scarlet A on: 09/30/2020 01:14 PM   Modules accepted: Orders

## 2020-10-01 ENCOUNTER — Telehealth: Payer: Self-pay | Admitting: Internal Medicine

## 2020-10-01 ENCOUNTER — Encounter: Payer: Self-pay | Admitting: Internal Medicine

## 2020-10-01 NOTE — Telephone Encounter (Signed)
   VYCTORIA DICKMAN DOB: 03-May-1956 MRN: [id number]   RIDER WAIVER AND RELEASE OF LIABILITY  For purposes of improving physical access to our facilities, [facility] is pleased to partner with third parties to provide [place] patients or other authorized individuals the option of convenient, on-demand ground transportation services (the [employer]") through use of the technology service that enables users to request on-demand ground transportation from independent third-party providers.  By opting to use and accept these [employer], I, the undersigned, hereby agree on behalf of myself, and on behalf of any minor child using the [employer] for whom I am the parent or legal guardian, as follows:  1. Science writer provided to me are provided by independent third-party transportation providers who are not [employer] or employees and who are unaffiliated with [employer]. 2. [facility] is neither a transportation carrier nor a common or public carrier. 3. [place] has no control over the quality or safety of the transportation that occurs as a result of the [employer]. 4. [facility] cannot guarantee that any third-party transportation provider will complete any arranged transportation service. 5. [place] makes no representation, warranty, or guarantee regarding the reliability, timeliness, quality, safety, suitability, or availability of any of the Transport Services or that they will be error free. 6. I fully understand that traveling by vehicle involves risks and dangers of serious bodily injury, including permanent disability, paralysis, and death. I agree, on behalf of myself and on behalf of any minor child using the Transport Services for whom I am the parent or legal guardian, that the entire risk arising out of my use of the [employer] remains solely with me, to the maximum extent permitted under applicable law. 7. The [employer] are provided "as is" and "as available." [place] disclaims all representations and warranties, express, implied or statutory, not expressly set out in these terms, including the implied warranties of merchantability and fitness for a particular purpose. 8. I hereby waive and release [facility], its agents, employees, officers, directors, representatives, insurers, attorneys, assigns, successors, subsidiaries, and affiliates from any and all past, present, or future claims, demands, liabilities, actions, causes of action, or suits of any kind directly or indirectly arising from acceptance and use of the [employer]. 9. I further waive and release [facility] and its affiliates from all present and future liability and responsibility for any injury or death to persons or damages to property caused by or related to the use of the [employer]. 10. I have read this Waiver and Release of Liability, and I understand the terms used in it and their legal significance. This Waiver is freely and voluntarily given with the understanding that my right (as well as the right of any minor child for whom I am the parent or legal guardian using the [employer]) to legal recourse against [place] in connection with the [employer] is knowingly surrendered in return for use of these services.   I attest that I read the consent document to Jeri Lager, gave [name] the opportunity to ask questions and answered the questions asked (if any). I affirm that Jeri Lager then provided consent for she's participation in this program.     Hessie Knows

## 2020-10-02 ENCOUNTER — Other Ambulatory Visit: Payer: Self-pay

## 2020-10-02 ENCOUNTER — Ambulatory Visit (INDEPENDENT_AMBULATORY_CARE_PROVIDER_SITE_OTHER): Payer: Medicare Other | Admitting: Pulmonary Disease

## 2020-10-02 ENCOUNTER — Encounter: Payer: Self-pay | Admitting: Pulmonary Disease

## 2020-10-02 VITALS — BP 128/72 | HR 81 | Ht 66.0 in | Wt 253.4 lb

## 2020-10-02 DIAGNOSIS — R0600 Dyspnea, unspecified: Secondary | ICD-10-CM | POA: Diagnosis not present

## 2020-10-02 DIAGNOSIS — R0609 Other forms of dyspnea: Secondary | ICD-10-CM

## 2020-10-02 NOTE — Patient Instructions (Addendum)
Nice to meet you  We will schedule breathing tests or pulmonary function tests to help see why you are short of breath.  We will touch base regarding results  Come back to see Dr. Judeth Horn in 3 months

## 2020-10-06 ENCOUNTER — Telehealth: Payer: Self-pay | Admitting: Pulmonary Disease

## 2020-10-06 NOTE — Telephone Encounter (Signed)
Called spoke with patient. Let her know we looked for it and was unable to locate it. Spoke with the nurse that worked with Dr. Judeth Horn that day and nothing was left behind. Patient voiced understanding. Nothing further needed at this time.

## 2020-10-07 NOTE — Progress Notes (Signed)
Internal Medicine Clinic Attending  Case discussed with Dr. Seawell at the time of the visit.  We reviewed the resident's history and exam and pertinent patient test results.  I agree with the assessment, diagnosis, and plan of care documented in the resident's note.  Dashiell Franchino, M.D., Ph.D.  

## 2020-10-10 NOTE — Progress Notes (Signed)
@Patient  ID: Sally Wright, female    DOB: 05/23/1956, 64 y.o.   MRN: [id number]  Chief Complaint  Patient presents with  . Shortness of Breath    Increased SOB since 10/2018.    Marland Kitchen Sleep Apnea    On cpap with nasal pillows since 2017.  Pt states she is not tolerating this well.     Referring provider: Oda Kilts, MD  HPI:   64 year old woman whom we are seeing in consultation for evaluation of DOE. Notes from referring provider reviewed.   She notes increasing SOB for going on 2 years. Seemed to really notice at start of "lockdown" during pandemic. No SOB at rest. Worse with inclines, stairs. Notes some weight gain. Activity level much less than pre-pandemic levels. No cough. No sinus drainage, nasal congestion. Lived in [place] but moved to [place] last year to help with a child. Recently returned a few months ago. No change for better or worse with breathing in [place] compared to [place].  Cannot identify seasonal changes.  No other alleviating or exacerbating factors.  She reports some concern for sleep apnea.  Reports of loved ones telling her that she snores loudly, occasionally pauses between breaths.  Most recent chest imaging 04/2017 personally reviewed and interpreted as suspected poor inspiration, prominent gastric bubble with appearance of slightly elevated left hemidiaphragm this was compared to 10/2016 and on my interpretation left hemidiaphragm elevation not present 10/2016.  PMH: Hypertension, diabetes, GERD Surgical history: Hysterectomy, joint replacement, thyroid resection Family history: Father with colon cancer, goiter, mother with goiter, sister with heart disease and breast cancer Social history: Former smoker, minimal 5-pack-year smoking history, recently moved back to [place] / Pulmonary Flowsheets:   ACT:  No flowsheet data found.  MMRC: No flowsheet data found.  Epworth:  No flowsheet data found.  Tests:   FENO:  No  results found for: NITRICOXIDE  PFT: No flowsheet data found.  WALK:  No flowsheet data found.  Imaging: Personally reviewed as per EMR DG Shoulder Left  Result Date: 09/25/2020 CLINICAL DATA:  Left shoulder pain EXAM: LEFT SHOULDER - 2+ VIEW COMPARISON:  None. FINDINGS: There is no evidence of fracture or dislocation. There is no evidence of arthropathy or other focal bone abnormality. Soft tissues are unremarkable. IMPRESSION: Negative. Electronically Signed   By: Fidela Salisbury MD   On: 09/25/2020 23:44    Lab Results: Personally reviewed CBC    Component Value Date/Time   WBC 9.4 09/26/2020 2353   RBC 4.08 09/26/2020 2353   HGB 12.6 09/26/2020 2353   HCT 37.4 09/26/2020 2353   PLT 518 (H) 09/26/2020 2353   MCV 91.7 09/26/2020 2353   MCH 30.9 09/26/2020 2353   MCHC 33.7 09/26/2020 2353   RDW 12.9 09/26/2020 2353    BMET    Component Value Date/Time   NA 131 (L) 09/26/2020 2353   NA 138 09/01/2020 1149   K 3.9 09/26/2020 2353   CL 95 (L) 09/26/2020 2353   CO2 24 09/26/2020 2353   GLUCOSE 350 (H) 09/26/2020 2353   BUN 15 09/26/2020 2353   BUN 12 09/01/2020 1149   CREATININE 0.90 09/26/2020 2353   CALCIUM 9.4 09/26/2020 2353   GFRNONAA >60 09/26/2020 2353   GFRAA 87 09/01/2020 1149    BNP No results found for: BNP  ProBNP No results found for: PROBNP  Specialty Problems      Pulmonary Problems   TB lung, latent    TST  pos to 13 mm on 11/04/16. She had a brother with TB related to AIDS treated in the 1980s      OSA (obstructive sleep apnea)      Allergies  Allergen Reactions  . Carvedilol     Tongue swelling    Immunization History  Administered Date(s) Administered  . Influenza,inj,Quad PF,6+ Mos 08/02/2016, 09/01/2020  . PFIZER SARS-COV-2 Vaccination 01/29/2020, 02/21/2020  . PPD Test 11/02/2016    Past Medical History:  Diagnosis Date  . Anxiety   . Depression   . Diabetes mellitus without complication ([place]) 1017  . GERD  (gastroesophageal reflux disease)   . Hyperlipidemia   . Hypertension   . Thyroid disease     Tobacco History: Social History   Tobacco Use  Smoking Status Former Smoker  . Packs/day: 0.50  . Years: 10.00  . Pack years: 5.00  . Types: Cigarettes  Smokeless Tobacco Never Used  Tobacco Comment   Quit 30 years ago   Counseling given: Not Answered Comment: Quit 30 years ago   Continue to not smoke  Outpatient Encounter Medications as of 10/02/2020  Medication Sig  . acetaminophen (TYLENOL) 500 MG tablet Take 500-1,000 mg by mouth every 6 (six) hours as needed for mild pain.  Marland Kitchen amLODipine (NORVASC) 10 MG tablet Take 1 tablet (10 mg total) by mouth daily.  . blood glucose meter kit and supplies KIT Check blood sugar before meals 3x/day for the first few days you are taking steroids  . glucose blood (WAVESENSE PRESTO) test strip Check blood sugar before meals 3x/day for the first few days you are taking steroids  . Lancets 30G MISC Check blood sugar before meals 3x/day for the first few days you are taking steroids  . levothyroxine (SYNTHROID) 125 MCG tablet Take 1 tablet (125 mcg total) by mouth daily.  . metFORMIN (GLUCOPHAGE) 500 MG tablet Take 1 tablet (500 mg total) by mouth daily with breakfast.  . omeprazole (PRILOSEC) 20 MG capsule Take 1 capsule (20 mg total) by mouth daily.  Marland Kitchen triamterene-hydrochlorothiazide (MAXZIDE-25) 37.5-25 MG tablet Take 1 tablet by mouth daily.  . Albuterol Sulfate (PROAIR RESPICLICK) 510 (90 Base) MCG/ACT AEPB Inhale 2 puffs into the lungs every 6 (six) hours as needed. (Patient not taking: Reported on 10/02/2020)  . ipratropium-albuterol (DUONEB) 0.5-2.5 (3) MG/3ML SOLN Take 3 mLs by nebulization every 6 (six) hours as needed. (Patient not taking: Reported on 10/02/2020)  . [DISCONTINUED] metFORMIN (GLUCOPHAGE) 500 MG tablet Take 1 tablet (500 mg total) by mouth daily with breakfast.   No facility-administered encounter medications on file as of  10/02/2020.     Review of Systems  Review of Systems  No chest pain with exertion.  No orthopnea or PND.  Conference review of systems otherwise negative.  Physical Exam  BP 128/72 (BP Location: Right Wrist, Cuff Size: Normal)   Pulse 81   Ht 5' 6"  (1.676 m)   Wt 253 lb 6.4 oz (114.9 kg)   SpO2 100%   BMI 40.90 kg/m   Wt Readings from Last 5 Encounters:  10/02/20 253 lb 6.4 oz (114.9 kg)  09/26/20 256 lb (116.1 kg)  09/25/20 256 lb 6.4 oz (116.3 kg)  09/01/20 261 lb 14.4 oz (118.8 kg)  07/14/17 252 lb 4.8 oz (114.4 kg)    BMI Readings from Last 5 Encounters:  10/02/20 40.90 kg/m  09/26/20 41.32 kg/m  09/25/20 41.38 kg/m  09/01/20 42.27 kg/m  07/14/17 40.72 kg/m     Physical Exam General: Well-appearing,  no acute distress Eyes: EOMI, no icterus Neck: Supple, no JVP appreciated Respiratory: Clear to station bilaterally, no wheezing Cardiovascular: Regular rhythm, no murmurs MSK: No synovitis, joint effusion Abdomen: Nondistended, bowel sounds present Neuro: Normal gait, no weakness Psych: Normal mood, full affect   Assessment & Plan:   Dyspnea on exertion: Suspect multifactorial.  Related to deconditioning, obesity/chest wall interference, possible asthma.  Do worry a bit about sleep apnea given reports.  This could be contributing.  Recommend obtaining PFTs in the coming weeks for further clarification on possible physiologic reasons to be short of breath, respiratory standpoint.  Possible sleep disordered breathing: Reports of her snoring loudly, possibly having pauses in breathing at night per reports of loved ones.  Discussed at length the role for testing and treating sleep apnea.  Discussed for optimal treatment would need to use CPAP machine with mask at night.  She is precontemplative about following through with the potential treatment.  She will consider this.  We will readdress at next visit, utility of testing for sleep apnea.   Return in about 3  months (around 12/31/2020).   Lanier Clam, MD 10/10/2020

## 2020-10-28 ENCOUNTER — Ambulatory Visit (INDEPENDENT_AMBULATORY_CARE_PROVIDER_SITE_OTHER): Payer: Medicare Other | Admitting: Pulmonary Disease

## 2020-10-28 ENCOUNTER — Other Ambulatory Visit: Payer: Self-pay

## 2020-10-28 DIAGNOSIS — R0609 Other forms of dyspnea: Secondary | ICD-10-CM

## 2020-10-28 DIAGNOSIS — R0600 Dyspnea, unspecified: Secondary | ICD-10-CM

## 2020-10-28 LAB — PULMONARY FUNCTION TEST
DL/VA % pred: 113 %
DL/VA: 5.71 ml/min/mmHg/L
DLCO cor % pred: 89 %
DLCO cor: 24.03 ml/min/mmHg
DLCO unc % pred: 89 %
DLCO unc: 24.03 ml/min/mmHg
FEF 25-75 Post: 2.28 L/sec
FEF 25-75 Pre: 1.88 L/sec
FEF2575-%Change-Post: 21 %
FEF2575-%Pred-Post: 119 %
FEF2575-%Pred-Pre: 98 %
FEV1-%Change-Post: 5 %
FEV1-%Pred-Post: 90 %
FEV1-%Pred-Pre: 85 %
FEV1-Post: 1.96 L
FEV1-Pre: 1.85 L
FEV1FVC-%Change-Post: 0 %
FEV1FVC-%Pred-Pre: 102 %
FEV6-%Change-Post: 6 %
FEV6-%Pred-Post: 92 %
FEV6-%Pred-Pre: 86 %
FEV6-Post: 2.44 L
FEV6-Pre: 2.28 L
FEV6FVC-%Pred-Post: 104 %
FEV6FVC-%Pred-Pre: 104 %
FVC-%Change-Post: 6 %
FVC-%Pred-Post: 88 %
FVC-%Pred-Pre: 82 %
FVC-Post: 2.44 L
FVC-Pre: 2.28 L
Post FEV1/FVC ratio: 80 %
Post FEV6/FVC ratio: 100 %
Pre FEV1/FVC ratio: 81 %
Pre FEV6/FVC Ratio: 100 %
RV % pred: 79 %
RV: 1.74 L
TLC % pred: 80 %
TLC: 4.33 L

## 2020-10-28 NOTE — Progress Notes (Signed)
Full PFT completed today ? ?

## 2020-11-14 ENCOUNTER — Telehealth: Payer: Self-pay | Admitting: Pulmonary Disease

## 2020-11-14 ENCOUNTER — Other Ambulatory Visit: Payer: Self-pay

## 2020-11-14 ENCOUNTER — Encounter: Payer: Self-pay | Admitting: Internal Medicine

## 2020-11-14 MED ORDER — LEVOTHYROXINE SODIUM 125 MCG PO TABS: 125.0000 ug | ORAL_TABLET | Freq: Every day | ORAL | 2 refills | Status: DC

## 2020-11-14 NOTE — Telephone Encounter (Signed)
Pt wanted to know about using an inhaler, if she should be using the nebulizer and she is needing cpap supplies.  She is not currently set up with any of the DME.  She would like to use [employer] for her cpap supplies and nebulizer.  She is still having the [facility] with walking and she wanted to make sure that she will need to use the inhaler and neb.  She stated that she feels like the mucus is too deep in her chest for her to get up.  MH please advise. thanks

## 2020-11-14 NOTE — Telephone Encounter (Signed)
MH please advise on the results of the PFT.  Thanks

## 2020-11-14 NOTE — Telephone Encounter (Signed)
levothyroxine (SYNTHROID) 125 MCG tablet, refill request @  [facility] [place], [place] - [address] AT Portal to Registered [employer] Sites Phone:  [phone]  Fax:  [phone]

## 2020-11-26 ENCOUNTER — Ambulatory Visit (INDEPENDENT_AMBULATORY_CARE_PROVIDER_SITE_OTHER): Payer: Medicare Other | Admitting: Student

## 2020-11-26 ENCOUNTER — Encounter: Payer: Self-pay | Admitting: Student

## 2020-11-26 ENCOUNTER — Other Ambulatory Visit: Payer: Self-pay

## 2020-11-26 VITALS — BP 158/83 | HR 83 | Temp 98.1°F | Ht 66.0 in | Wt 258.6 lb

## 2020-11-26 DIAGNOSIS — R748 Abnormal levels of other serum enzymes: Secondary | ICD-10-CM | POA: Diagnosis not present

## 2020-11-26 DIAGNOSIS — E039 Hypothyroidism, unspecified: Secondary | ICD-10-CM | POA: Diagnosis not present

## 2020-11-26 DIAGNOSIS — M25512 Pain in left shoulder: Secondary | ICD-10-CM

## 2020-11-26 DIAGNOSIS — E1165 Type 2 diabetes mellitus with hyperglycemia: Secondary | ICD-10-CM | POA: Diagnosis present

## 2020-11-26 DIAGNOSIS — G8929 Other chronic pain: Secondary | ICD-10-CM | POA: Diagnosis not present

## 2020-11-26 DIAGNOSIS — I10 Essential (primary) hypertension: Secondary | ICD-10-CM

## 2020-11-26 DIAGNOSIS — E1169 Type 2 diabetes mellitus with other specified complication: Secondary | ICD-10-CM

## 2020-11-26 DIAGNOSIS — Z0000 Encounter for general adult medical examination without abnormal findings: Secondary | ICD-10-CM

## 2020-11-26 LAB — POCT GLYCOSYLATED HEMOGLOBIN (HGB A1C): Hemoglobin A1C: 9.8 % — AB (ref 4.0–5.6)

## 2020-11-26 LAB — GLUCOSE, CAPILLARY: Glucose-Capillary: 209 mg/dL — ABNORMAL HIGH (ref 70–99)

## 2020-11-26 MED ORDER — SYNJARDY 5-500 MG PO TABS: 1.0000 | ORAL_TABLET | Freq: Two times a day (BID) | ORAL | 2 refills | Status: DC

## 2020-11-26 MED ORDER — OLMESARTAN MEDOXOMIL 20 MG PO TABS: 20.0000 mg | ORAL_TABLET | Freq: Every day | ORAL | 2 refills | Status: DC

## 2020-11-26 NOTE — Assessment & Plan Note (Addendum)
Patient's hemoglobin A1c continues to trend up (9.8 from 8.9, 7.1, 6.4, 6.1). Patient reports that she is only taking metformin 500mg  daily. She states that she is tolerating this medication well without adverse effects and reports that she tolerated a daily dose of 1000mg  well in the past. She is motivated to have tighter control of her diabetes. She denies checking her blood glucose readings at home since losing her glucose monitor. -Provided sample of Synjardy 10-1000mg  daily for seven days -Prescribed Synjardy 5-500mg  twice daily (per insurance preference) -Discontinue metformin tablets -Follow-up HbA1c in 3 months -Urine microalbumin/creatinine  ADDENDUM: Patient's albumin/creatinine ratio moderately elevated. Patient unable to afford Synjardy and medication is not available through her pharmacy. Patient is currently working with our [facility] pharmacy team on obtaining financial support. In the meantime, patient would benefit from increasing her metformin as she is currently taking metformin 500mg  once daily. -Increase metformin from 500mg  once daily to twice daily, patient may benefit from further titration up as tolerated

## 2020-11-26 NOTE — Patient Instructions (Signed)
[name],  It was a pleasure meeting you in clinic today.  For your diabetes: Please take the sample of synjardy 10-1000mg  every morning for the next week. I have placed a prescription for synjardy 5-500mg , which when you receive through the mail, you should take twice daily. When taking the synjardy, you can stop taking the metformin.  For your high blood pressure: Continue taking the amlodipine 10mg  every day. We will restart a similar medication to a medicine you previously took for this condition. I have prescribed olmesartan 20mg  daily which you should receive through the mail.  For your hypothyroidism: We will collect a TSH today in clinic. I will call you with the results. For the meantime, continue taking levothyroxine daily.  Sincerely, Dr. , MD

## 2020-11-26 NOTE — Assessment & Plan Note (Signed)
Patient's blood pressure in clinic today elevated to 158/83 then 150/83. Patient currently only taking amlodipine 10mg  daily. She denies headaches, chest pain, blurry vision, dizziness, lightheadedness. Patient would benefit from better control of this chronic condition. -Continue amlodipine 10mg  daily -Start olmesartan 20mg  daily, if tolerates well consider titrating up and/or doing combination pill with amlodipine

## 2020-11-26 NOTE — Assessment & Plan Note (Signed)
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 Patient's levothyroxine dose was decreased from to approximately two months ago. -Obtain TSH today -Continue levothyroxine , consider dose adjustment pending TSH

## 2020-11-26 NOTE — Assessment & Plan Note (Signed)
Patient due for colonoscopy. -Colonoscopy referral placed

## 2020-11-26 NOTE — Progress Notes (Signed)
   CC: Follow-up chronic conditions  HPI:  [name] is a 65 y.o. female with past medical history significant for HTN, T2DM and hypothyroidism who presnts to clinic today for routine follow-up. Refer to problem list for charting of this encounter.  Past Medical History:  Diagnosis Date  . Anxiety   . Depression   . Diabetes mellitus without complication (HCC) 1996  . GERD (gastroesophageal reflux disease)   . Hyperlipidemia   . Hypertension   . Thyroid disease    Review of Systems:  Patient denies chest pain, shortness of breath, headaches, lightheadedness, dizziness, polyuria, polydipsia.  Physical Exam:  Vitals:   11/26/20 1000 11/26/20 1011  BP: (!) 150/83 (!) 158/83  Pulse: 93 83  Temp: 98.1 F (36.7 C)   TempSrc: Oral   SpO2: 100%   Weight: 258 lb 9.6 oz (117.3 kg)   Height: 5\' 6"  (1.676 m)    Physical Exam Vitals reviewed.  Constitutional:      General: She is not in acute distress.    Appearance: She is obese. She is not ill-appearing.  Cardiovascular:     Rate and Rhythm: Normal rate and regular rhythm.     Pulses: Normal pulses.     Heart sounds: Normal heart sounds.  Pulmonary:     Effort: Pulmonary effort is normal. No respiratory distress.     Breath sounds: Normal breath sounds.  Abdominal:     General: Abdomen is flat. Bowel sounds are normal.     Palpations: Abdomen is soft.     Tenderness: There is no abdominal tenderness.  Musculoskeletal:        General: Normal range of motion.     Right lower leg: No edema.     Left lower leg: No edema.  Skin:    General: Skin is warm and dry.     Capillary Refill: Capillary refill takes less than 2 seconds.  Neurological:     General: No focal deficit present.     Mental Status: She is alert. Mental status is at baseline.  Psychiatric:        Mood and Affect: Mood normal.        Behavior: Behavior normal.    Assessment & Plan:   See Encounters Tab for problem based charting.  Patient  discussed with Dr. 

## 2020-11-27 ENCOUNTER — Telehealth: Payer: Self-pay

## 2020-11-27 ENCOUNTER — Telehealth: Payer: Self-pay | Admitting: *Deleted

## 2020-11-27 ENCOUNTER — Other Ambulatory Visit: Payer: Self-pay | Admitting: Student

## 2020-11-27 DIAGNOSIS — E1165 Type 2 diabetes mellitus with hyperglycemia: Secondary | ICD-10-CM

## 2020-11-27 LAB — CMP14 + ANION GAP
ALT: 20 IU/L (ref 0–32)
AST: 20 IU/L (ref 0–40)
Albumin/Globulin Ratio: 1.3 (ref 1.2–2.2)
Albumin: 4.4 g/dL (ref 3.8–4.8)
Alkaline Phosphatase: 149 IU/L — ABNORMAL HIGH (ref 44–121)
Anion Gap: 14 mmol/L (ref 10.0–18.0)
BUN/Creatinine Ratio: 13 (ref 12–28)
BUN: 9 mg/dL (ref 8–27)
Bilirubin Total: 0.4 mg/dL (ref 0.0–1.2)
CO2: 24 mmol/L (ref 20–29)
Calcium: 9.4 mg/dL (ref 8.7–10.3)
Chloride: 99 mmol/L (ref 96–106)
Creatinine, Ser: 0.71 mg/dL (ref 0.57–1.00)
GFR calc Af Amer: 103 mL/min/{1.73_m2} (ref >59)
GFR calc non Af Amer: 89 mL/min/{1.73_m2} (ref >59)
Globulin, Total: 3.3 g/dL (ref 1.5–4.5)
Glucose: 232 mg/dL — ABNORMAL HIGH (ref 65–99)
Potassium: 4.3 mmol/L (ref 3.5–5.2)
Sodium: 137 mmol/L (ref 134–144)
Total Protein: 7.7 g/dL (ref 6.0–8.5)

## 2020-11-27 LAB — MICROALBUMIN / CREATININE URINE RATIO
Creatinine, Urine: 162.5 mg/dL
Microalb/Creat Ratio: 118 mg/g creat — ABNORMAL HIGH (ref 0–29)
Microalbumin, Urine: 191.8 ug/mL

## 2020-11-27 LAB — TSH: TSH: 0.476 u[IU]/mL (ref 0.450–4.500)

## 2020-11-27 NOTE — Telephone Encounter (Signed)
Spoke to [name] about getting her some patient assistance with [employer] for her Sally Wright. Patient is stopping by [facility] on 12/02/20 to sign an app with me, provide financials, & her insurance card.

## 2020-11-27 NOTE — Telephone Encounter (Signed)
Thank you, I have not yet met this patient

## 2020-11-27 NOTE — Telephone Encounter (Signed)
Patient called in stating the Kirk Ruths that was ordered yesterday will cost $526 which she cannot afford. Will route to [employer] and Pharm D for assistance. Kinnie Feil, BSN, RN-BC

## 2020-11-28 MED ORDER — METFORMIN HCL 500 MG PO TABS: 500.0000 mg | ORAL_TABLET | Freq: Two times a day (BID) | ORAL | 3 refills | Status: DC

## 2020-11-28 MED ORDER — AMLODIPINE BESYLATE 10 MG PO TABS: 10.0000 mg | ORAL_TABLET | Freq: Every day | ORAL | 3 refills | Status: DC

## 2020-11-28 NOTE — Telephone Encounter (Signed)
Attempted to contact patient by telephone without success. I will re-attempt to contact patient this afternoon. While pharmacy is working with patient on obtaining financial support for [place] (or similar options), I will request for the patient to increase her metformin as she has been only taking 500mg  every morning.

## 2020-11-28 NOTE — Progress Notes (Signed)
Internal Medicine Clinic Attending  Case discussed with Dr. Wynetta Emery  At the time of the visit.  We reviewed the resident's history and exam and pertinent patient test results.  I agree with the assessment, diagnosis, and plan of care documented in the resident's note. Alk phos found to be mildly elevated again, would check Vitamin d level at next opportunity.

## 2020-11-28 NOTE — Telephone Encounter (Signed)
Was this the patient you were speaking to earlier? I couldn't remember

## 2020-11-28 NOTE — Addendum Note (Signed)
Addended by: Jasmine December on: 11/28/2020 12:42 PM   Modules accepted: Orders

## 2020-12-02 ENCOUNTER — Other Ambulatory Visit: Payer: Self-pay | Admitting: *Deleted

## 2020-12-02 DIAGNOSIS — I10 Essential (primary) hypertension: Secondary | ICD-10-CM

## 2020-12-02 MED ORDER — BUDESONIDE-FORMOTEROL FUMARATE 80-4.5 MCG/ACT IN AERO: 2.0000 | INHALATION_SPRAY | Freq: Two times a day (BID) | RESPIRATORY_TRACT | 12 refills | Status: DC

## 2020-12-02 MED ORDER — OLMESARTAN MEDOXOMIL 20 MG PO TABS: 20.0000 mg | ORAL_TABLET | Freq: Every day | ORAL | 1 refills | Status: DC

## 2020-12-02 NOTE — Telephone Encounter (Signed)
Sorry,  I meant to state that pharmacy is requesting a 90-day supply

## 2020-12-02 NOTE — Telephone Encounter (Signed)
Hi Darlene, it looks like this medicine was prescribed very recently already.  What am I missing?

## 2020-12-02 NOTE — Telephone Encounter (Signed)
Ok, I'll approve the 90 day supply

## 2020-12-02 NOTE — Telephone Encounter (Signed)
Sally Wright, Sally Sago, MD  [facility] Triage Pool 33 minutes ago (3:32 PM)   MH  Can try inhaler - sent script for symbicort 2 puff BID. She should use the nebulized duonebs as needed. Can someone please assist with DME orders for CPAP supplies (ok with sending everything) and nebulizer machine? Thanks!    Attempted to call pt but unable to reach. Left message for her to return call.

## 2020-12-03 NOTE — Telephone Encounter (Signed)
Called and LM on VM for the pt to call back.  

## 2020-12-05 ENCOUNTER — Telehealth: Payer: Self-pay | Admitting: Pulmonary Disease

## 2020-12-05 MED ORDER — BUDESONIDE-FORMOTEROL FUMARATE 80-4.5 MCG/ACT IN AERO: 2.0000 | INHALATION_SPRAY | Freq: Two times a day (BID) | RESPIRATORY_TRACT | 3 refills | Status: DC

## 2020-12-05 MED ORDER — IPRATROPIUM-ALBUTEROL 0.5-2.5 (3) MG/3ML IN SOLN: 3.0000 mL | Freq: Four times a day (QID) | RESPIRATORY_TRACT | 3 refills | Status: DC | PRN

## 2020-12-05 NOTE — Telephone Encounter (Signed)
Hunsucker, Lesia Sago, MD to [facility] Triage [facility] 3:32 PM Can try inhaler - sent script for symbicort 2 puff BID. She should use the nebulized duonebs as needed. Can someone please assist with DME orders for CPAP supplies (ok with sending everything) and nebulizer machine? Thanks!      Patient is aware of above recommendations and voiced her understanding.  Rx for Symbicort and duoneb has been sent [facility] per patient request. I have contacted [facility] pharmacy and canceled Symbicort Rx. Patient stated that she does not need a Rx for cpap supplies, as this has already be taken care of and she does not need a nebulizer machine only the medication.  Nothing further needed at this time.

## 2020-12-22 ENCOUNTER — Ambulatory Visit (INDEPENDENT_AMBULATORY_CARE_PROVIDER_SITE_OTHER): Payer: Medicare Other | Admitting: Student

## 2020-12-22 ENCOUNTER — Encounter: Payer: Self-pay | Admitting: Student

## 2020-12-22 ENCOUNTER — Other Ambulatory Visit: Payer: Self-pay

## 2020-12-22 VITALS — BP 154/84 | HR 78 | Temp 98.4°F | Ht 66.0 in | Wt 253.4 lb

## 2020-12-22 DIAGNOSIS — E119 Type 2 diabetes mellitus without complications: Secondary | ICD-10-CM | POA: Diagnosis not present

## 2020-12-22 DIAGNOSIS — Z1231 Encounter for screening mammogram for malignant neoplasm of breast: Secondary | ICD-10-CM | POA: Diagnosis not present

## 2020-12-22 DIAGNOSIS — I10 Essential (primary) hypertension: Secondary | ICD-10-CM | POA: Diagnosis present

## 2020-12-22 DIAGNOSIS — Z0000 Encounter for general adult medical examination without abnormal findings: Secondary | ICD-10-CM | POA: Diagnosis not present

## 2020-12-22 MED ORDER — LOSARTAN POTASSIUM 50 MG PO TABS: 50.0000 mg | ORAL_TABLET | Freq: Every day | ORAL | 11 refills | Status: DC

## 2020-12-22 MED ORDER — TRULICITY 0.75 MG/0.5ML ~~LOC~~ SOAJ: 0.7500 mg | SUBCUTANEOUS | 0 refills | Status: DC

## 2020-12-22 MED ORDER — METFORMIN HCL ER 500 MG PO TB24: 500.0000 mg | ORAL_TABLET | Freq: Two times a day (BID) | ORAL | 11 refills | Status: DC

## 2020-12-22 NOTE — Progress Notes (Signed)
   CC: HTN and T2DM follow-up  HPI:  Sally Wright is a 65 y.o. female with past medical history significant for HTN and T2DM who presents to clinic for 2-week follow-up. Refer to problem list for charting of this encounter.  Past Medical History:  Diagnosis Date  . Anxiety   . Depression   . Diabetes mellitus without complication (HCC) 1996  . GERD (gastroesophageal reflux disease)   . Hyperlipidemia   . Hypertension   . Thyroid disease    Review of Systems: Endorses mild abdominal discomfort and diarrhea. Denies chest pain, shortness of breath, lower extremity swelling, polyuria, polydipsia.  Physical Exam:  Vitals:   12/22/20 1347 12/22/20 1401  BP: (!) 157/82 (!) 154/84  Pulse: 89 78  Temp: 98.4 F (36.9 C)   TempSrc: Oral   SpO2: 100%   Weight: 253 lb 6.4 oz (114.9 kg)   Height: 5\' 6"  (1.676 m)    Physical Exam Constitutional:      General: She is not in acute distress.    Appearance: She is obese. She is not ill-appearing.  Cardiovascular:     Rate and Rhythm: Normal rate and regular rhythm.     Pulses: Normal pulses.     Heart sounds: Normal heart sounds.  Pulmonary:     Effort: Pulmonary effort is normal.     Breath sounds: Normal breath sounds.  Abdominal:     General: Abdomen is flat. Bowel sounds are normal.     Palpations: Abdomen is soft.     Tenderness: There is no abdominal tenderness.  Musculoskeletal:     Right lower leg: No edema.     Left lower leg: No edema.  Neurological:     General: No focal deficit present.     Mental Status: She is alert. Mental status is at baseline.  Psychiatric:        Mood and Affect: Mood normal.        Behavior: Behavior normal.      Assessment & Plan:   See Encounters Tab for problem based charting.  Patient discussed with Dr. 

## 2020-12-22 NOTE — Assessment & Plan Note (Signed)
Since last office visit, patient has continued taking amlodipine 10mg  daily however she was unable to start olmesartan 20mg  daily due to cost. Blood pressure today in clinic is 154/84. Patient would benefit from better control of her hypertension. Upon chart review, patient previously prescribed losartan and reported appropriate tolerance of this medication. -Continue amlodipine 10mg  daily -Start losartan 50mg  daily -Discontinue olmesartan 20mg  daily

## 2020-12-22 NOTE — Patient Instructions (Addendum)
Sally Wright,  It was a pleasure seeing you in clinic today.  For your hypertension (high blood pressure): We recommend that you continue taking amlodipine 10mg  daily and start taking losartan 50mg  daily as well. I have prescribed this medication to be sent through your pharmacy.  For your diabetes (high blood sugars): We recommend that you start taking extended release tablets of metformin. Please take 500mg  twice daily. I have sent a prescription of the extended release tablets to your pharmacy. I also recommend that you start a weekly injection of a medication called Trulicity. This medication will help with both diabetes and may also help with weight loss. Finally, I have placed a referral for you to meet with [facility].  We recommend that you return to clinic in 2 weeks to follow-up and obtain blood work.  Sincerely, Dr. , MD

## 2020-12-22 NOTE — Assessment & Plan Note (Signed)
Patient due for screening mammogram. -Referral to mammogram ordered

## 2020-12-22 NOTE — Assessment & Plan Note (Signed)
Since last office visit, patient has been taking metformin 500mg  immediate release tablets twice daily. She attempted to increase her dose further but had some abdominal discomfort and diarrhea. She was prescribed synjardy at her last office visit but this medication was too expensive for her to obtain. -Transition metformin from immediate to extended release tablets (500mg  twice daily) -Start GLP1 agonist (Trulicity 0.75mg  weekly), consider titrating dose up -Referral to [facility]

## 2020-12-23 ENCOUNTER — Telehealth: Payer: Self-pay | Admitting: *Deleted

## 2020-12-23 NOTE — Telephone Encounter (Signed)
Patient called in stating Rx for Trulicity written yesterday will cost > $500. She is requesting assistance from [employer].

## 2020-12-23 NOTE — Telephone Encounter (Signed)
I haven't yet met Ms. Hines - and thank  you for helping with this.

## 2020-12-23 NOTE — Telephone Encounter (Signed)
Hi Camille,  I know you helped this patient in the past with Synjardy. Perhaps you still have most of her info and we can work on submitting PAP for Trulicity?  Thanks!

## 2020-12-24 NOTE — Telephone Encounter (Signed)
Yes maam, I reached out to her and we're going to mail an application from [employer] AND for [employer]Me since she wants her symbicort as well (its expensive). Her deductible is $480, and even after reaching that her Trulicity will be $46 monthly and she cannot afford that. She will bring her completed apps to her appointment on 01/05/21. And that synjardy is still pending approval by the way!

## 2020-12-24 NOTE — Progress Notes (Signed)
Internal Medicine Clinic Attending  Case discussed with Dr. Johnson  At the time of the visit.  We reviewed the resident's history and exam and pertinent patient test results.  I agree with the assessment, diagnosis, and plan of care documented in the resident's note.  

## 2020-12-29 ENCOUNTER — Ambulatory Visit (INDEPENDENT_AMBULATORY_CARE_PROVIDER_SITE_OTHER): Payer: Medicare Other | Admitting: Internal Medicine

## 2020-12-29 ENCOUNTER — Encounter: Payer: Self-pay | Admitting: Internal Medicine

## 2020-12-29 VITALS — BP 130/80 | HR 84 | Ht 64.75 in | Wt 247.4 lb

## 2020-12-29 DIAGNOSIS — R110 Nausea: Secondary | ICD-10-CM

## 2020-12-29 DIAGNOSIS — R159 Full incontinence of feces: Secondary | ICD-10-CM

## 2020-12-29 DIAGNOSIS — R195 Other fecal abnormalities: Secondary | ICD-10-CM | POA: Diagnosis not present

## 2020-12-29 DIAGNOSIS — K219 Gastro-esophageal reflux disease without esophagitis: Secondary | ICD-10-CM

## 2020-12-29 MED ORDER — OMEPRAZOLE 40 MG PO CPDR: 40.0000 mg | DELAYED_RELEASE_CAPSULE | Freq: Every day | ORAL | 3 refills | Status: DC

## 2020-12-29 MED ORDER — OMEPRAZOLE 40 MG PO CPDR: 40.0000 mg | DELAYED_RELEASE_CAPSULE | Freq: Every day | ORAL | 0 refills | Status: DC

## 2020-12-29 NOTE — Progress Notes (Signed)
HISTORY OF PRESENT ILLNESS:  Sally Wright is a 64 y.o. female with past medical history as listed below who presents today with chief complaint of fecal leakage over the past month, loose stools, nausea, and active reflux symptoms.  Patient was last seen in this office June 29, 2017 regarding acute abdominal pain felt secondary to infectious enteritis.  Also chronic abdominal bloating and untreated GERD.  Also ordered a history of peptic ulcer disease elsewhere.  Testing for Helicobacter pylori was negative.  Patient tells me that she has been on metformin for some time.  She has not had a change in dosage, but this is being contemplated.  She reports significant active reflux symptoms chronic nausea.  No longer taking her omeprazole.  She does request medication refill.  No dysphagia.  Terms of her bowels, no bleeding.  No abdominal pain.  She has completed her COVID vaccination series and booster.  Last colonoscopy was performed in [facility] March 2015.  Examination was complete with good preparation.  Sigmoid diverticulosis but otherwise normal.  Follow-up in 10 years recommended.  Review of most recent blood work from November 26, 2020 shows unremarkable comprehensive metabolic panel except for elevated glucose and mild elevation of alkaline phosphatase.  Last hemoglobin from December 2021 was 12.6.  REVIEW OF SYSTEMS:  All non-GI ROS negative unless otherwise stated in the HPI except for arthritis, fatigue, shortness of breath  Past Medical History:  Diagnosis Date  . Anxiety   . Depression   . Diabetes mellitus without complication (HCC) 1996  . GERD (gastroesophageal reflux disease)   . Hyperlipidemia   . Hypertension   . Hypothyroidism   . Sleep apnea with use of continuous positive airway pressure (CPAP)     Past Surgical History:  Procedure Laterality Date  . ABDOMINAL HYSTERECTOMY  2006   Partial hysterectomy  . CATARACT EXTRACTION, BILATERAL Bilateral 11/2019,  12/3019  . EYE MUSCLE SURGERY Bilateral 03/05/2020  . THYROIDECTOMY    . TOTAL KNEE ARTHROPLASTY Right 2017    Social History Sally Wright  reports that she quit smoking about 40 years ago. Her smoking use included cigarettes. She has a 5.00 pack-year smoking history. She has never used smokeless tobacco. She reports current alcohol use. She reports that she does not use drugs.  family history includes Breast cancer in her maternal aunt and sister; Colon cancer (age of onset: 3) in her father; Congestive Heart Failure in her paternal grandmother; Diabetes in her brother, father, and sister; Goiter in her father and mother; Heart disease in her maternal grandmother and sister; Kidney disease in her maternal uncle; Liver disease in her sister.  Allergies  Allergen Reactions  . Biotin Itching    OTC biotin   . Carvedilol     Tongue swelling       PHYSICAL EXAMINATION: Vital signs: BP 130/80 (BP Location: Left Arm, Patient Position: Sitting, Cuff Size: Large)   Pulse 84   Ht 5' 4.75" (1.645 m) Comment: height measured without shoes  Wt 247 lb 6 oz (112.2 kg)   BMI 41.48 kg/m   Constitutional: generally well-appearing, no acute distress Psychiatric: alert and oriented x3, cooperative Eyes: extraocular movements intact, anicteric, conjunctiva pink Mouth: oral pharynx moist, no lesions Neck: supple no lymphadenopathy Cardiovascular: heart regular rate and rhythm, no murmur Lungs: clear to auscultation bilaterally Abdomen: soft, obese, nontender, nondistended, no obvious ascites, no peritoneal signs, normal bowel sounds, no organomegaly Rectal: Small external hemorrhoid tags.  No tenderness or mass.  Brown Hemoccult negative stool.  Good tone Extremities: no lower extremity edema bilaterally Skin: no lesions on visible extremities Neuro: No focal deficits.  Cranial nerves intact  ASSESSMENT:  1.  Fecal leakage associated with loose stools.  Not full incontinence 2.   Colonoscopy elsewhere 2015 with sigmoid diverticulosis 3.  GERD with active symptoms 4.  Chronic nausea.  May be related to untreated GERD  PLAN:  1.  Reflux precautions with attention to weight loss.  Reviewed 2.  Prescribe omeprazole 40 mg daily.  Medication risks reviewed 3.  Initiate Citrucel 2 tablespoons daily for improvement in bowel consistency and hopefully less issues with incontinence 4.  Wear protective padding until problems with fecal soilage improved 5.  Due for routine colonoscopy around March 2025.  Patient aware 6.  Interval follow-up as needed Total time of 45 minutes was spent preparing to see the patient, reviewing outside tests and records, obtaining comprehensive history, performing comprehensive physical examination, counseling the patient regarding the above clinical issues, ordering medications, and documenting clinical information in the health record

## 2020-12-29 NOTE — Patient Instructions (Signed)
We have sent the following medications to your pharmacy for you to pick up at your convenience:  Omeprazole 40mg .  Take 2 tablespoons of Citrucel daily

## 2021-01-05 ENCOUNTER — Ambulatory Visit (INDEPENDENT_AMBULATORY_CARE_PROVIDER_SITE_OTHER): Payer: Medicare Other | Admitting: Internal Medicine

## 2021-01-05 ENCOUNTER — Ambulatory Visit (INDEPENDENT_AMBULATORY_CARE_PROVIDER_SITE_OTHER): Payer: Medicare Other | Admitting: Dietician

## 2021-01-05 ENCOUNTER — Ambulatory Visit (HOSPITAL_COMMUNITY)
Admission: RE | Admit: 2021-01-05 | Discharge: 2021-01-05 | Disposition: A | Discharge status: Home / Self Care | Payer: Medicare Other | Source: Ambulatory Visit | Attending: Internal Medicine | Admitting: Internal Medicine

## 2021-01-05 ENCOUNTER — Encounter: Payer: Self-pay | Admitting: Dietician

## 2021-01-05 VITALS — BP 135/80 | HR 80 | Temp 98.2°F | Ht 66.0 in | Wt 254.0 lb

## 2021-01-05 DIAGNOSIS — E785 Hyperlipidemia, unspecified: Secondary | ICD-10-CM | POA: Diagnosis not present

## 2021-01-05 DIAGNOSIS — E782 Mixed hyperlipidemia: Secondary | ICD-10-CM | POA: Diagnosis not present

## 2021-01-05 DIAGNOSIS — I10 Essential (primary) hypertension: Secondary | ICD-10-CM

## 2021-01-05 DIAGNOSIS — E119 Type 2 diabetes mellitus without complications: Secondary | ICD-10-CM

## 2021-01-05 DIAGNOSIS — R0602 Shortness of breath: Secondary | ICD-10-CM | POA: Diagnosis not present

## 2021-01-05 MED ORDER — ROSUVASTATIN CALCIUM 5 MG PO TABS: 5.0000 mg | ORAL_TABLET | Freq: Every day | ORAL | 2 refills | Status: DC

## 2021-01-05 NOTE — Progress Notes (Signed)
   Medical Nutrition Therapy :  Appt start time: 1500 end time:  1600. Total time: 60 Visit # 1 Assessment:  Primary concerns today: glucose meter and weight loss Sally Wright wants to decrease her weight because she knows it can help with her diabetes. She is interested in preventing complications. She describes a generally health intake of low sat fat, high fiber with fruits, vegetables, whole grains and nuts. She denies previous meetings for weight loss, but did have education when diagnosed with diabetes ~ 41 years ago.   Preferred Learning Style:No preference indicated  Learning Readiness: Ready and Change in progress  ANTHROPOMETRICS: Estimated body mass index is 41 kg/m as calculated from the following:   Height as of an earlier encounter on 01/05/21: 5\' 6"  (1.676 m).   Weight as of an earlier encounter on 01/05/21: 254 lb (115.2 kg).  WEIGHT HISTORY:  Wt Readings from Last 10 Encounters:  01/05/21 254 lb (115.2 kg)  12/29/20 247 lb 6 oz (112.2 kg)  12/22/20 253 lb 6.4 oz (114.9 kg)  11/26/20 258 lb 9.6 oz (117.3 kg)  10/02/20 253 lb 6.4 oz (114.9 kg)  09/26/20 256 lb (116.1 kg)  09/25/20 256 lb 6.4 oz (116.3 kg)  09/01/20 261 lb 14.4 oz (118.8 kg)  07/14/17 252 lb 4.8 oz (114.4 kg)  06/29/17 247 lb (112 kg)    SLEEP- has CPAP and uses it ~ 4 hours, having problems with fit and dryness  MEDICATIONS: metformin BLOOD SUGAR: 179 on her meter 3-4 hours after her smoothie DIETARY INTAKE: Usual eating pattern includes 3 meals and 2-3 snacks per day. Everyday foods include fruits, vegetables, whole grains avoided foods- eggs, dairy- milk, yogurt, cheese, Diarrhea: improved some with Citrucel  Dining Out (times/week): need to assess at future visit 24-hr recall:  B (10-12 AM): smoothie- ~ 1c.berries , 1 scoop protein powder, spinach, a few crackers L ( 3 PM): 08/29/17 sandwich, lettuce tomato, vegan may, avacado Snk ( PM): * D ( PM): chicken thighs x 2 air fried, greens with  bouillon, rice uncle bens long grain seasoned (tonight she plans to eat cabbage, corned  Beef and potatoes) Snks ( PM): apple, orange, pear, grapes, crackers, nuts, chips 1 time a month, a few crackers  Beverages: 2-3 64 oz water/day,. Occasionally unsweet tea  Usual physical activity: does a [employer] program  Estimated daily  energy needs: 1700-2100 calories for weight loss   Progress Towards Goal(s):  In progress.   Nutritional Diagnosis:  NB-1.1 Food and nutrition-related knowledge deficit As related to lack of education and support for weight loss.  As evidenced by her report and questions.    Intervention:  Nutrition education about using her new sample one touch reflect glucose meter and weight loss. Action Goal: write down what and how much she eats daily for 7 days  Outcome goal: increased knowledge of how much she eats and how weight loss is achieved.  Coordination of care: request testing supplies  Teaching Method Utilized: Visual, Auditory,Hands on Handouts given during visit include: After visit summary  Barriers to learning/adherence to lifestyle change: competing values Demonstrated degree of understanding via:  Teach Back   Monitoring/Evaluation:  Dietary intake, exercise, meter and food records, and body weight in 3 week(s). [place], RD 01/05/2021 5:14 PM.

## 2021-01-05 NOTE — Addendum Note (Signed)
Addended by: Guinevere Scarlet A on: 01/05/2021 02:53 PM   Modules accepted: Orders

## 2021-01-05 NOTE — Assessment & Plan Note (Signed)
She previously had myalgia with atorvastatin and is hesitant to start today. We discussed the importance of statin therapy with her ASCVD risk of 31.1%. She is agreeable to starting alternative statin at lower dose and we will titrate up if possible.  - start rosuvastatin 5 mg qd

## 2021-01-05 NOTE — Assessment & Plan Note (Signed)
During last office visit one month ago she was switched from metformin immediate to metformin 500 mg xr and started on trulicity .75mg  weekly. She has/has not been checking gluocose at home as she lost her glucometer. She is meeting with RD today.   - f/u with - she will obtain new glucometer and record glucose and bring this at f/u  - f/u in two months for a1c  - start rosuvastatin 5 mg qd - she has a history of myalgia with atorvastatin and is hesitant to start statin but agrees to start low and we will titrate up if no symptoms.

## 2021-01-05 NOTE — Progress Notes (Signed)
   CC: hypertension  HPI:  [name] is a 65 y.o. with PMH as below.   Please see A&P for assessment of the patient's acute and chronic medical conditions.   She has been having shortness of breath with exertion for the past year. She was started on a nebulizer in [place], which did not help. She was referred to pulmonology and had PFTs, which were normal. She is continuing to have shortness of breath with exertion, even walking the parking lot which is new for her. No chest pain but she does sometimes have associated chest tightness. No cough. She denies nausea, left arm tingling or pain or jaw pain. She has not noticed any LE swelling. She is compliant with her CPAP. She is not on a statin as she had myalgias with atorvastatin in the past. ASCVD risk is 31.1%, other risk factors include obesity, hypertension, and diabetes. She stopped smoking 30 years ago. Baseline ECG done today similar to prior in 2018 except for later T wave flattening.  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   Medications include norvasc 10 mg. She was started on losartan 50 mg one month ago as she was unable to obtain olmesartan prior to this. Repeat BP 135/71. She is using her cpap machine.  No swelling. She has been having leg cramps that are getting worse since stopping triameterene HCTZ.   During last office visit one month ago she was switched from metformin immediate to metformin 500 mg xr and started on trulicity .75mg  weekly. She has/has not been checking gluocose at home as she lost her glucometer. She is meeting with RD today.   Past Medical History:  Diagnosis Date  . Anxiety   . Depression   . Diabetes mellitus without complication (HCC) 1996  . GERD (gastroesophageal reflux disease)   . Hyperlipidemia   . Hypertension   . Hypothyroidism   . Sleep apnea with use of continuous positive airway pressure (CPAP)    Review of Systems:   10 point ROS  negative except as noted in HPI  Physical Exam:  Constitution: NAD, appears stated age, obese HENT: [place]/AT Cardio: RRR, no m/r/g, no LE edema, no JVP, no LE edema  Respiratory: CTA, no w/r/r MSK: moving all extremities Neuro: normal affect, a&ox3 Skin: c/d/i   Vitals:   01/05/21 1329  BP: (!) 157/81  Pulse: 90  Temp: 98.2 F (36.8 C)  TempSrc: Oral  SpO2: 100%  Weight: 254 lb (115.2 kg)  Height: 5\' 6"  (1.676 m)    Assessment & Plan:   See Encounters Tab for problem based charting.  Patient discussed with Dr. 01/07/21

## 2021-01-05 NOTE — Assessment & Plan Note (Addendum)
BP Readings from Last 3 Encounters:  01/05/21 (!) 157/81  12/29/20 130/80  12/22/20 (!) 154/84   Repeat 135/71. Medications include norvasc 10 mg. She was started on losartan 50 mg one month ago as she was unable to obtain olmesartan prior to this. Of note, she was also taking triamterene-HCTZ every other day up until about a week ago, which she had been prescribed in [place].135/71. She is using her cpap machine.  No swelling. She has been having leg cramps that are getting worse since stopping triameterene HCTZ.   - check BMP  - cont. norvasc 10 mg and losartan 50 mg qd  - discontinue trimaterene-HCTZ - will hold off on additional medication pending echo

## 2021-01-05 NOTE — Assessment & Plan Note (Signed)
She has been having shortness of breath with exertion for the past year. She was started on a nebulizer in [place], which did not help. She was referred to pulmonology and had PFTs, which were normal. She is continuing to have shortness of breath with exertion, even walking the parking lot which is new for her. No chest pain but she does sometimes have associated chest tightness. No cough. She denies nausea, left arm tingling or pain or jaw pain. She has not noticed any LE swelling. She is compliant with her CPAP. She is not on a statin as she had myalgias with atorvastatin in the past. ASCVD risk is 31.1%, other risk factors include obesity, hypertension, and diabetes. She stopped smoking 30 years ago. Baseline ECG done today similar to prior in 2018 except for later T wave flattening.  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   - Echo ordered today - referral for stress test  - start rosuvastatin 5 mg - she is agreeable to starting low and titrating up if she has no AE

## 2021-01-05 NOTE — Patient Instructions (Signed)
Thank you for allowing [place] to provide your care today. Today we discussed your shortness of breath     I have ordered the following labs for you: Basic metabolic panel     I will call if any are abnormal.    Today we made the following changes to your medications:   Please START taking  Rosuvastatin 5 mg - take 1 tablet per day   If you begin to develop muscle aches please decrease to one tablet every other day. If you are able to take this medication every day for one month, we can increase to 10 mg.   Please follow-up in two months for a1c check.    Please call the internal medicine center clinic if you have any questions or concerns, we may be able to help and keep you from a long and expensive emergency room wait. Our clinic and after hours phone number is [phone], the best time to call is Monday through Friday 9 am to 4 pm but there is always someone available 24/7 if you have an emergency. If you need medication refills please notify your pharmacy one week in advance and they will send [place] a request.

## 2021-01-05 NOTE — Patient Instructions (Addendum)
Thank you for your visit today!  We talked about:   Using a blood glucose meter-bring this with you What you eat Calorie counting for weight loss   Goals to work on:   Writing down what/everything you put in your mouth and brig this with you.   Please feel free to call me anytime.  Lupita Leash [phone]

## 2021-01-06 ENCOUNTER — Other Ambulatory Visit: Payer: Self-pay | Admitting: Dietician

## 2021-01-06 DIAGNOSIS — E119 Type 2 diabetes mellitus without complications: Secondary | ICD-10-CM

## 2021-01-06 LAB — BMP8+ANION GAP
Anion Gap: 15 mmol/L (ref 10.0–18.0)
BUN/Creatinine Ratio: 28 (ref 12–28)
BUN: 18 mg/dL (ref 8–27)
CO2: 24 mmol/L (ref 20–29)
Calcium: 9.8 mg/dL (ref 8.7–10.3)
Chloride: 100 mmol/L (ref 96–106)
Creatinine, Ser: 0.64 mg/dL (ref 0.57–1.00)
Glucose: 173 mg/dL — ABNORMAL HIGH (ref 65–99)
Potassium: 3.9 mmol/L (ref 3.5–5.2)
Sodium: 139 mmol/L (ref 134–144)
eGFR: 97 mL/min/{1.73_m2} (ref >59)

## 2021-01-06 MED ORDER — ONETOUCH VERIO VI STRP: ORAL_STRIP | 3 refills | Status: DC

## 2021-01-06 MED ORDER — ONETOUCH DELICA PLUS LANCET33G MISC: 3 refills | Status: DC

## 2021-01-06 NOTE — Telephone Encounter (Signed)
Sally Wright requests diabetes supplies

## 2021-01-06 NOTE — Progress Notes (Signed)
Internal Medicine Clinic Attending  Case discussed with Dr. Seawell  At the time of the visit.  We reviewed the resident's history and exam and pertinent patient test results.  I agree with the assessment, diagnosis, and plan of care documented in the resident's note.  

## 2021-01-07 NOTE — Progress Notes (Signed)
Received notification from [employer]) regarding approval for [employer] XR 10MG /1000MG . Patient assistance approved from 01/02/21 to 10/24/21.  Meds will ship to patients home. Patient can call refills in over the phone, using RX number on bottle  Phone: [phone]

## 2021-01-07 NOTE — Progress Notes (Signed)
Excellent!  Thank you so much!

## 2021-01-08 ENCOUNTER — Telehealth: Payer: Self-pay

## 2021-01-08 DIAGNOSIS — E119 Type 2 diabetes mellitus without complications: Secondary | ICD-10-CM

## 2021-01-08 NOTE — Telephone Encounter (Signed)
Requesting to speak with a nurse about meds.  

## 2021-01-08 NOTE — Telephone Encounter (Signed)
Return pt's call - stated she just go approved for Synjardy. Stated metformin was changed to Metfromin XR b/c she could afford it. But since she has been approved; she wants to know what to do about the metformin and Metformin XR? Thanks

## 2021-01-09 MED ORDER — SYNJARDY 5-500 MG PO TABS: 1.0000 | ORAL_TABLET | Freq: Two times a day (BID) | ORAL | 1 refills | Status: DC

## 2021-01-09 NOTE — Telephone Encounter (Signed)
She can take the synjardy and discard the metformin. What is the dosage of the synjardy? I can add it to her medication list.

## 2021-01-09 NOTE — Telephone Encounter (Signed)
Pt called back- stated she has received the [facility] which is 5 mg/ 500 mg twice daily in the  morning and afternoon.

## 2021-01-09 NOTE — Telephone Encounter (Signed)
Pt has not received medication yet, but dose is SYNJARDY XR 10MG /1000MG -pt will call back once received. Cassady3/18/202211:42 AM

## 2021-01-18 ENCOUNTER — Other Ambulatory Visit: Payer: Self-pay | Admitting: Student

## 2021-01-19 ENCOUNTER — Encounter: Payer: Self-pay | Admitting: *Deleted

## 2021-01-26 ENCOUNTER — Encounter: Payer: Self-pay | Admitting: Dietician

## 2021-01-26 ENCOUNTER — Other Ambulatory Visit: Payer: Self-pay

## 2021-01-26 ENCOUNTER — Ambulatory Visit (INDEPENDENT_AMBULATORY_CARE_PROVIDER_SITE_OTHER): Payer: Medicare Other | Admitting: Dietician

## 2021-01-26 ENCOUNTER — Other Ambulatory Visit: Payer: Self-pay | Admitting: Dietician

## 2021-01-26 DIAGNOSIS — E119 Type 2 diabetes mellitus without complications: Secondary | ICD-10-CM

## 2021-01-26 LAB — GLUCOSE, CAPILLARY: Glucose-Capillary: 94 mg/dL (ref 70–99)

## 2021-01-26 MED ORDER — ONETOUCH DELICA PLUS LANCET33G MISC: 3 refills | Status: DC

## 2021-01-26 MED ORDER — ONETOUCH VERIO VI STRP: ORAL_STRIP | 3 refills | Status: DC

## 2021-01-26 NOTE — Progress Notes (Signed)
Medical Nutrition Therapy :  Appt start time: 1316 end time:  1415. Total time: 59 Visit # 2 Assessment:  Primary concerns today: glucose meter and weight loss Ms. Aina wants to decrease her weight because she knows it can help her diabetes. She is interested in preventing complications. She kept food records for 7 days that included her calorie intake. She lost ~ 2-3# and her blood sugar is lower. She rn out of strips to continue checking her blood sugar. She feels that her weight loss is not enough and that her food records were not good. She states he has a difficult time with sticking with things and being consistent.  She says she likes fat, likes to cook and knows she needs to decrease her porlk fat. We discussed making or buying sausage with ground [place].   ANTHROPOMETRICS: Estimated body mass index is 40.53 kg/m as calculated from the following:   Height as of 01/05/21: 5\' 6"  (1.676 m).   Weight as of this encounter: 251 lb 1.6 oz (113.9 kg).  WEIGHT HISTORY: she thinks the 247# was a mistake. Wt Readings from Last 10 Encounters:  01/26/21 251 lb 1.6 oz (113.9 kg)  01/05/21 254 lb (115.2 kg)  12/29/20 247 lb 6 oz (112.2 kg)  12/22/20 253 lb 6.4 oz (114.9 kg)  11/26/20 258 lb 9.6 oz (117.3 kg)  10/02/20 253 lb 6.4 oz (114.9 kg)  09/26/20 256 lb (116.1 kg)  09/25/20 256 lb 6.4 oz (116.3 kg)  09/01/20 261 lb 14.4 oz (118.8 kg)  07/14/17 252 lb 4.8 oz (114.4 kg)   SLEEP- has CPAP and uses it ~ 4 hours, having problems with fit and dryness MEDICATIONS: synjardy BLOOD SUGAR: 179 after smoothie last visit. Today it was 94 after her smoothie.  CBG (last 3)  Recent Labs    01/26/21 1408  GLUCAP 94   DIETARY INTAKE: Usual eating pattern includes 3 meals and 2-3 snacks per day.  Dining Out (times/week): seldom Food records showed 1300-1400 calories per day.  B (10-12 AM): smoothie- ~ 1c.berries , 1 scoop protein powder, spinach, a few crackers L ( 3 PM): 03/28/21 sandwich,  lettuce tomato, vegan may, avacado Snk ( PM): chips a large bag D ( PM): corned beef, potates,  cabbage Snks ( PM): ice cream  Beverages: 2-3 64 oz water/day,. Occasionally unsweet tea  Usual physical activity: does a [employer] program  Estimated daily  energy needs: 1700-2100 calories for weight loss   Progress Towards Goal(s):  Some progress.   Nutritional Diagnosis:  NB-1.1 Food and nutrition-related knowledge deficit As related to lack of education and support for weight loss.  As evidenced by her report and questions.    Intervention:  Nutrition education and motivational intervieing to help her see her successes rather than failures and what she has been consistent in doing in the past as well as continued education about weight loss and diabetes Action Goal: write down what and how much she eats daily for 7 days and total calories for each day  Outcome goal: increased knowledge of how much she eats and how weight loss is achieved.  Coordination of care: request testing supplies  Teaching Method Utilized: Visual, Auditory,Hands on Handouts given during visit include: After visit summary  Barriers to learning/adherence to lifestyle change: competing values Demonstrated degree of understanding via:  Teach Back   Monitoring/Evaluation:  Dietary intake, exercise, meter and food records, and body weight in 2 week(s). [place], RD 01/26/2021 3:49 PM.

## 2021-01-26 NOTE — Patient Instructions (Addendum)
You did great at keeping food records and lowering your weight and blood sugars.    Let's do the same for next couple with totaling of calories at the end of day or as you go along.   Wt Readings from Last 10 Encounters:  01/26/21 251 lb 1.6 oz (113.9 kg)  01/05/21 254 lb (115.2 kg)  12/29/20 247 lb 6 oz (112.2 kg)  12/22/20 253 lb 6.4 oz (114.9 kg)  11/26/20 258 lb 9.6 oz (117.3 kg)  10/02/20 253 lb 6.4 oz (114.9 kg)  09/26/20 256 lb (116.1 kg)  09/25/20 256 lb 6.4 oz (116.3 kg)  09/01/20 261 lb 14.4 oz (118.8 kg)  07/14/17 252 lb 4.8 oz (114.4 kg)   Rosea Dory ([phone]) [phone]

## 2021-01-26 NOTE — Telephone Encounter (Signed)
Ms. Macfarlane would like her testing supplies sent to [facility]

## 2021-02-04 NOTE — Progress Notes (Signed)
Received notification from [facility] [employer] regarding approval for TRULICITY 0.75MG . Patient assistance approved from 02/04/21 to 10/24/21.  Medication will ship to patients home.  [facility]: [phone]  [place] Phone: [phone]

## 2021-02-04 NOTE — Progress Notes (Signed)
Submitted application for SYMBICORT to [place] for patient assistance.   Phone: [phone]

## 2021-02-04 NOTE — Progress Notes (Signed)
Submitted application for TRULICITY 0.75MG/0.5ML to [employer] for patient assistance.   Phone: [phone]  

## 2021-02-05 ENCOUNTER — Encounter: Payer: Medicare Other | Admitting: Internal Medicine

## 2021-02-09 ENCOUNTER — Telehealth: Payer: Self-pay

## 2021-02-09 DIAGNOSIS — E119 Type 2 diabetes mellitus without complications: Secondary | ICD-10-CM

## 2021-02-09 MED ORDER — ONETOUCH VERIO VI STRP
ORAL_STRIP | 3 refills | Status: AC
Start: 2021-02-09 — End: ?

## 2021-02-09 MED ORDER — ONETOUCH DELICA PLUS LANCET33G MISC: 3 refills | Status: AC

## 2021-02-09 NOTE — Telephone Encounter (Signed)
Thank you, Lupita Leash. I appreciate you assisting this patient. I will attach the front desk staff se we can set up a follow up appointment to discuss her DM.   Can we set her up for a telehealth appointment at the end of this week or early next weeks to review her diabetes regimen?  Best regards,  Dellia Cloud

## 2021-02-09 NOTE — Telephone Encounter (Signed)
I called Sally Wright today. She states she is taking Synjardy for past month (got it for free from the company), she wants to know if she has take both that and the Trulicity. ( She states her goal is to decrease medicine and she already increased from metformin to synjardy since her last A1C.) she is tolerating it.  She does not know what her blood sugars are because she has not been able to get strips. We agreed that she would get strips as soon as possible and start checking her blood sugars to help provided information and I would send a note to the doctors about her taking both vs one of the diabetes medicines. I told her that maybe we could follow up with her toward the end of the week when she has more blood sugar information to answer her concern.

## 2021-02-09 NOTE — Telephone Encounter (Signed)
Sally Wright, Looks like this patient has an appt w/ you tomorrow.  From what I can tell from briefly looking at her chart, she was recently approved for Trulicity.  Triage received a denial for her test strips, twice daily testing.  I am assuming if she is starting injections, she may be able to do this now.  Do you mind looking into this tomorrow and resending RX for strips/lancest w/ new Dx code in applicable? Thanks, [employer]

## 2021-02-09 NOTE — Telephone Encounter (Signed)
Pt is requesting a call back regarding some medicine.  Stacee: Patient cancel appt for Lupita Leash

## 2021-02-10 ENCOUNTER — Ambulatory Visit: Payer: Medicare Other | Admitting: Dietician

## 2021-02-13 ENCOUNTER — Other Ambulatory Visit: Payer: Self-pay

## 2021-02-13 ENCOUNTER — Ambulatory Visit: Payer: Medicare Other | Admitting: Internal Medicine

## 2021-02-13 DIAGNOSIS — E119 Type 2 diabetes mellitus without complications: Secondary | ICD-10-CM

## 2021-02-17 ENCOUNTER — Encounter: Payer: Self-pay | Admitting: Internal Medicine

## 2021-02-17 ENCOUNTER — Telehealth: Payer: Self-pay

## 2021-02-17 NOTE — Progress Notes (Signed)
Patient will need a follow up appointment in the office in one week to follow up her diabetes regimen. At the time she will be able to have follow up labs performed and questions answered.    Chari Manning, D.O.  Internal Medicine Resident, PGY-2 [facility]  Pager: [phone] 7:57 AM, 02/17/2021

## 2021-02-17 NOTE — Telephone Encounter (Signed)
Progress Notes by Dellia Cloud, MD at 02/13/2021 10:15 AM  Author: Dellia Cloud, MD Author Type: Resident Filed: 02/17/2021 7:58 AM  Note Status: Signed Cosign: Cosign Not Required Encounter Date: 02/13/2021  Editor: Dellia Cloud, MD (Resident)             Patient will need a follow up appointment in the office in one week to follow up her diabetes regimen. At the time she will be able to have follow up labs performed and questions answered.             TC to patient, f/u appt made for 02/20/21 w/ Dr. Karilyn Cota at [phone] (Dr. Perfecto Kingdom schedule is full). SChaplin, RN,BSN

## 2021-02-17 NOTE — Progress Notes (Signed)
Received notification from [employer] regarding approval for SYMBICORT 80/4.5 INHALER. Patient assistance approved from 02/17/21 to 10/24/21.  Meds will ship to patients home in 7-10 business days and will remain on auto refill.  Phone: [phone]

## 2021-02-17 NOTE — Progress Notes (Signed)
Thanks for the update

## 2021-02-20 ENCOUNTER — Encounter: Payer: Self-pay | Admitting: Internal Medicine

## 2021-02-20 ENCOUNTER — Other Ambulatory Visit: Payer: Self-pay

## 2021-02-20 ENCOUNTER — Ambulatory Visit (INDEPENDENT_AMBULATORY_CARE_PROVIDER_SITE_OTHER): Payer: Medicare Other | Admitting: Internal Medicine

## 2021-02-20 VITALS — BP 151/89 | HR 84 | Temp 98.2°F | Ht 65.0 in | Wt 247.6 lb

## 2021-02-20 DIAGNOSIS — E119 Type 2 diabetes mellitus without complications: Secondary | ICD-10-CM | POA: Diagnosis present

## 2021-02-20 DIAGNOSIS — G4733 Obstructive sleep apnea (adult) (pediatric): Secondary | ICD-10-CM

## 2021-02-20 DIAGNOSIS — I10 Essential (primary) hypertension: Secondary | ICD-10-CM

## 2021-02-20 LAB — GLUCOSE, CAPILLARY: Glucose-Capillary: 102 mg/dL — ABNORMAL HIGH (ref 70–99)

## 2021-02-20 LAB — POCT GLYCOSYLATED HEMOGLOBIN (HGB A1C): Hemoglobin A1C: 6.8 % — AB (ref 4.0–5.6)

## 2021-02-20 NOTE — Progress Notes (Signed)
   CC: Diabetes, hypertension  HPI:  [name] is a 65 y.o. with a past medical history listed below presenting for evaluation of her diabetes and hypertension. For details of today's visit and the status of his chronic medical issues please refer to the assessment and plan.   Past Medical History:  Diagnosis Date  . Anxiety   . Depression   . Diabetes mellitus without complication (HCC) 1996  . GERD (gastroesophageal reflux disease)   . Hyperlipidemia   . Hypertension   . Hypothyroidism   . Sleep apnea with use of continuous positive airway pressure (CPAP)    Review of Systems:   Review of Systems  Cardiovascular: Negative for chest pain and leg swelling.  Gastrointestinal: Negative for abdominal pain, nausea and vomiting.  Genitourinary: Negative for dysuria, frequency and urgency.  Neurological: Negative for dizziness, weakness and headaches.     Physical Exam:  Vitals:   02/20/21 0915 02/20/21 0918  BP: (!) 165/93 (!) 151/89  Pulse: 92 84  Temp: 98.2 F (36.8 C)   TempSrc: Oral   SpO2: 98%   Weight: 247 lb 9.6 oz (112.3 kg)   Height: 5\' 5"  (1.651 m)    Physical Exam Vitals reviewed.  Constitutional:      Appearance: Normal appearance.  Cardiovascular:     Rate and Rhythm: Normal rate and regular rhythm.     Pulses: Normal pulses.     Heart sounds: Normal heart sounds. No murmur heard. No friction rub. No gallop.   Pulmonary:     Effort: Pulmonary effort is normal. No respiratory distress.     Breath sounds: Normal breath sounds. No wheezing or rales.  Abdominal:     General: Abdomen is flat. Bowel sounds are normal. There is no distension.     Palpations: Abdomen is soft.     Tenderness: There is no abdominal tenderness. There is no guarding.  Musculoskeletal:        General: No swelling or tenderness.     Right lower leg: No edema.     Left lower leg: No edema.  Skin:    General: Skin is warm and dry.  Neurological:     Mental Status:  She is alert and oriented to person, place, and time.  Psychiatric:        Mood and Affect: Mood normal.        Behavior: Behavior normal.        Thought Content: Thought content normal.        Judgment: Judgment normal.     Assessment & Plan:   See Encounters Tab for problem based charting.  Patient discussed with Dr. 

## 2021-02-20 NOTE — Assessment & Plan Note (Signed)
Hemoglobin A1c 6.8 today.  She has been taking Synjardy 5-500 mg twice daily and Trulicity 0.75 mg weekly she is tolerating these medications well.  She forgot to bring her glucose meter today.  Recommend she bring this to her next visit.  Recommend continuing current regimen and following up in 3 months for repeat hemoglobin A1c.  Plan: Continue Synjardy and Trulicity Follow-up hemoglobin A1c in 3 months

## 2021-02-20 NOTE — Assessment & Plan Note (Signed)
BP Readings from Last 3 Encounters:  02/20/21 (!) 151/89  01/05/21 135/80  12/29/20 130/80   Patient is currently taking amlodipine 10 mg and losartan 50 mg.  Blood pressure is above goal today.  Patient denies any symptoms.  She is currently reporting issues with her CPAP machine, needing a new mask fitting.  States this was set up for her in [place] of last year.  She has not set up with anyone here.  States the company she receives her supplies from his [employer].  Will send a referral for evaluation. Will continue current regimen and recheck BP in one month.  Plan: Continue amlodipine 10 mg and losartan 50 mg daily Follow up in 4 weeks for BP check Make sure patient is refitting for mask for CPAP machine to assure compliance

## 2021-02-20 NOTE — Assessment & Plan Note (Signed)
Patient is reporting issues with current CPAP mask. She had this set up in [place] 08/2020. She currently reports pain on her nasal bridge with her current mask and also reports nose bleeds due to increased pressure. She states she called [employer] who set up an in person appt for her in [place] but she was unable to arrange transportation for this appt.  Will place a DME order to [employer] to further evaluate this and hopefully provide an in person mask fitting.  If this is not possible she may need to be referred back to [facility] pulmonology.  Plan: DME order placed for CPAP supplies through [employer]

## 2021-02-20 NOTE — Patient Instructions (Signed)
Thank you for allowing [place] to provide your care today. Today we discussed your diabetes and sleep apnea.    I have ordered labs for you. I will call if any are abnormal.    I will figure out who can help with the CPAP machine and give you a call.  Today we made no changes to your medications.    Please follow-up in 1 month.    Should you have any questions or concerns please call the internal medicine clinic at [phone].

## 2021-02-26 NOTE — Progress Notes (Signed)
Internal Medicine Clinic Attending ° °Case discussed with Dr. Rehman  At the time of the visit.  We reviewed the resident’s history and exam and pertinent patient test results.  I agree with the assessment, diagnosis, and plan of care documented in the resident’s note.  ° °

## 2021-03-03 NOTE — Progress Notes (Deleted)
Cardiology Office Note:    Date:  03/03/2021   ID:  Sally Wright, DOB 02-18-1956, MRN [id number]  PCP:  Miguel Aschoff, MD   [facility] Providers Cardiologist:  None {   Referring MD: Reymundo Poll, MD    History of Present Illness:    Sally Wright is a 65 y.o. female with a hx of anxiety, depression, DMII, HTN, HLD and OSA on CPAP who was referred by Dr. Antony Contras for further evaluation of shortness of breath.  Today,   Past Medical History:  Diagnosis Date  . Anxiety   . Depression   . Diabetes mellitus without complication (HCC) 1996  . GERD (gastroesophageal reflux disease)   . Hyperlipidemia   . Hypertension   . Hypothyroidism   . Sleep apnea with use of continuous positive airway pressure (CPAP)     Past Surgical History:  Procedure Laterality Date  . ABDOMINAL HYSTERECTOMY  2006   Partial hysterectomy  . CATARACT EXTRACTION, BILATERAL Bilateral 11/2019, 12/3019  . EYE MUSCLE SURGERY Bilateral 03/05/2020  . THYROIDECTOMY    . TOTAL KNEE ARTHROPLASTY Right 2017    Current Medications: No outpatient medications have been marked as taking for the 03/05/21 encounter (Appointment) with Meriam Sprague, MD.     Allergies:   Biotin and Carvedilol   Social History   Socioeconomic History  . Marital status: Divorced    Spouse name: Not on file  . Number of children: 1  . Years of education: 31  . Highest education level: Not on file  Occupational History  . Occupation: Teacher Asst  Tobacco Use  . Smoking status: Former Smoker    Packs/day: 0.50    Years: 10.00    Pack years: 5.00    Types: Cigarettes    Quit date: 12/29/1980    Years since quitting: 40.2  . Smokeless tobacco: Never Used  . Tobacco comment: Quit 30 years ago  Vaping Use  . Vaping Use: Never used  Substance and Sexual Activity  . Alcohol use: Yes    Comment: rarely, maybe 3 times a year  . Drug use: No  . Sexual activity: Not on file  Other Topics Concern   . Not on file  Social History Narrative  . Not on file   Social Determinants of Health   Financial Resource Strain: Not on file  Food Insecurity: Not on file  Transportation Needs: Not on file  Physical Activity: Not on file  Stress: Not on file  Social Connections: Not on file     Family History: The patient's ***family history includes Breast cancer in her maternal aunt and sister; Colon cancer (age of onset: 51) in her father; Congestive Heart Failure in her paternal grandmother; Diabetes in her brother, father, and sister; Goiter in her father and mother; Heart disease in her maternal grandmother and sister; Kidney disease in her maternal uncle; Liver disease in her sister.  ROS:   Please see the history of present illness.    *** All other systems reviewed and are negative.  EKGs/Labs/Other Studies Reviewed:    The following studies were reviewed today: TTE 06/15/2017: - Left ventricle: The cavity size was mildly dilated. Systolic  function was mildly reduced. The estimated ejection fraction was  in the range of 45% to 50%. Diffuse hypokinesis. Left ventricular  diastolic function parameters were normal.  - Mitral valve: There was mild regurgitation.  - Left atrium: The atrium was mildly dilated.  - Impressions: Abnormal GLS -12.5.  EKG:  EKG is *** ordered today.  The ekg ordered today demonstrates ***  Recent Labs: 09/26/2020: Hemoglobin 12.6; Platelets 518 11/26/2020: ALT 20; TSH 0.476 01/05/2021: BUN 18; Creatinine, Ser 0.64; Potassium 3.9; Sodium 139  Recent Lipid Panel    Component Value Date/Time   CHOL 202 (H) 09/01/2020 1149   TRIG 106 09/01/2020 1149   HDL 56 09/01/2020 1149   CHOLHDL 3.6 09/01/2020 1149   LDLCALC 127 (H) 09/01/2020 1149     Risk Assessment/Calculations:   {Does this patient have ATRIAL FIBRILLATION?:[phone]}   Physical Exam:    VS:  There were no vitals taken for this visit.    Wt Readings from Last 3 Encounters:   02/20/21 247 lb 9.6 oz (112.3 kg)  01/26/21 251 lb 1.6 oz (113.9 kg)  01/05/21 254 lb (115.2 kg)     GEN: *** Well nourished, well developed in no acute distress HEENT: Normal NECK: No JVD; No carotid bruits LYMPHATICS: No lymphadenopathy CARDIAC: ***RRR, no murmurs, rubs, gallops RESPIRATORY:  Clear to auscultation without rales, wheezing or rhonchi  ABDOMEN: Soft, non-tender, non-distended MUSCULOSKELETAL:  No edema; No deformity  SKIN: Warm and dry NEUROLOGIC:  Alert and oriented x 3 PSYCHIATRIC:  Normal affect   ASSESSMENT:    No diagnosis found. PLAN:    In order of problems listed above:  #Shortness of breath: -TTE scheduled  #Resistent HTN: -Continue amlodipine 10mg  daily -Continue losartan 50mg  daily  #DMII: Managed by PCP.  #HLD: -Start crestor 10mg  daily  #OSA on CPAP: -Continue CPAP   {Are you ordering a CV Procedure (e.g. stress test, cath, DCCV, TEE, etc)?   Press F2        :    Medication Adjustments/Labs and Tests Ordered: Current medicines are reviewed at length with the patient today.  Concerns regarding medicines are outlined above.  No orders of the defined types were placed in this encounter.  No orders of the defined types were placed in this encounter.   There are no Patient Instructions on file for this visit.   Signed, , MD  03/03/2021 8:57 PM    [facility]

## 2021-03-05 ENCOUNTER — Encounter: Payer: Self-pay | Admitting: *Deleted

## 2021-03-05 ENCOUNTER — Encounter: Payer: Self-pay | Admitting: Cardiology

## 2021-03-05 ENCOUNTER — Telehealth: Payer: Self-pay | Admitting: *Deleted

## 2021-03-05 ENCOUNTER — Ambulatory Visit (INDEPENDENT_AMBULATORY_CARE_PROVIDER_SITE_OTHER): Payer: Medicare Other | Admitting: Cardiology

## 2021-03-05 ENCOUNTER — Other Ambulatory Visit: Payer: Self-pay

## 2021-03-05 VITALS — BP 150/88 | HR 91 | Ht 65.0 in | Wt 242.0 lb

## 2021-03-05 DIAGNOSIS — R0602 Shortness of breath: Secondary | ICD-10-CM | POA: Diagnosis not present

## 2021-03-05 DIAGNOSIS — I5022 Chronic systolic (congestive) heart failure: Secondary | ICD-10-CM

## 2021-03-05 DIAGNOSIS — G4733 Obstructive sleep apnea (adult) (pediatric): Secondary | ICD-10-CM | POA: Diagnosis not present

## 2021-03-05 DIAGNOSIS — Z789 Other specified health status: Secondary | ICD-10-CM

## 2021-03-05 DIAGNOSIS — I10 Essential (primary) hypertension: Secondary | ICD-10-CM

## 2021-03-05 DIAGNOSIS — Z79899 Other long term (current) drug therapy: Secondary | ICD-10-CM | POA: Diagnosis not present

## 2021-03-05 DIAGNOSIS — E785 Hyperlipidemia, unspecified: Secondary | ICD-10-CM

## 2021-03-05 MED ORDER — LOSARTAN POTASSIUM 100 MG PO TABS
100.0000 mg | ORAL_TABLET | Freq: Every day | ORAL | 1 refills | Status: DC
Start: 2021-03-05 — End: 2021-07-16

## 2021-03-05 NOTE — Telephone Encounter (Signed)
-----   Message from Ivy M Martin, LPN sent at 03/05/2021  2:12 PM EDT ----- Regarding: NEEDS TO ESTABLISH WITH [facility] FOR OSA This pt has known OSA and has an old and recalled device from out of state Provider.  She needs another sleep study and to establish with [facility]. Sleep study order in epic.  Pt is aware you will call to coordinate.  Can you please arrange and shoot me the date?   Thanks, Ivy    

## 2021-03-05 NOTE — Telephone Encounter (Signed)
-----   Message from Loa Socks, LPN sent at 4/85/4627  2:12 PM EDT ----- Regarding: NEEDS TO ESTABLISH WITH [facility] FOR OSA This pt has known OSA and has an old and recalled device from [place] of state Provider.  She needs another sleep study and to establish with [facility]. Sleep study order in epic.  Pt is aware you will call to coordinate.  Can you please arrange and shoot me the date?   Thanks, [employer]

## 2021-03-05 NOTE — Telephone Encounter (Signed)
Patient's insurance does not require a prior auth. Message will be sent to [facility] for scheduling.

## 2021-03-05 NOTE — Patient Instructions (Signed)
Medication Instructions:   INCREASE YOUR LOSARTAN TO 100 MG BY MOUTH DAILY  *If you need a refill on your cardiac medications before your next appointment, please call your pharmacy*   Lab Work:  IN ONE WEEK--BMET  If you have labs (blood work) drawn today and your tests are completely normal, you will receive your results only by: Marland Kitchen MyChart Message (if you have MyChart) OR . A paper copy in the mail If you have any lab test that is abnormal or we need to change your treatment, we will call you to review the results.   You have been referred to [facility] HERE IN THE OFFICE FOR FURTHER MANAGEMENT   Testing/Procedures:  Your physician has recommended that you have a sleep study. This test records several body functions during sleep, including: brain activity, eye movement, oxygen and carbon dioxide blood levels, heart rate and rhythm, breathing rate and rhythm, the flow of air through your mouth and nose, snoring, body muscle movements, and chest and belly movement.  Your physician has requested that you have a lexiscan myoview. For further information please visit [email or web address] Please follow instruction sheet, as given.   Follow-Up:  3 MONTHS IN THE OFFICE WITH EXTENDER OR DR. Shari Prows

## 2021-03-05 NOTE — Progress Notes (Signed)
Cardiology Office Note:    Date:  03/05/2021   ID:  Sally Wright, DOB 1956-06-27, MRN [id number]  PCP:  Angelica Pou, MD   [facility] Providers Cardiologist:  None {   Referring MD: Velna Ochs, MD    History of Present Illness:    Sally Wright is a 65 y.o. female with a hx of anxiety, depression, DMII, HTN, HLD and OSA on CPAP who was referred by Dr. Philipp Ovens for further evaluation of shortness of breath.  Today, the patient states that she has been having shortness of breath for several months. Mainly with exertion but can occur at rest as well. No associated chest pain, nausea, vomiting or lightheadedness.  She denies any lower extremity edema, orthopnea or PND. Of note, she had an echo in 2018 where LVEF 45-50%. Was started on coreg, but had an allergy to the medication. Has not had cardiology follow-up.  Of note, she has a diagnosis of severe OSA. She is not currently using her CPAP as it is borken. She is looking for a provider to follow this issue.  She also has not been taking any cholesterol medication because she previously used Avastatin and rosuvastatin, but taking them made her develop LE cramps and she felt sick. She subsequently stopped them. LDL 127.  Family history notable for CAD in her sister s/p DES x5. Her paternal grandmother also had heart disease.  Past Medical History:  Diagnosis Date  . Anxiety   . Depression   . Diabetes mellitus without complication ([place]) 8466  . GERD (gastroesophageal reflux disease)   . Hyperlipidemia   . Hypertension   . Hypothyroidism   . Sleep apnea with use of continuous positive airway pressure (CPAP)     Past Surgical History:  Procedure Laterality Date  . ABDOMINAL HYSTERECTOMY  2006   Partial hysterectomy  . CATARACT EXTRACTION, BILATERAL Bilateral 11/2019, 12/3019  . EYE MUSCLE SURGERY Bilateral 03/05/2020  . THYROIDECTOMY    . TOTAL KNEE ARTHROPLASTY Right 2017    Current  Medications: Current Meds  Medication Sig  . acetaminophen (TYLENOL) 500 MG tablet Take 1,000 mg by mouth 2 (two) times daily.  Marland Kitchen amLODipine (NORVASC) 10 MG tablet Take 1 tablet (10 mg total) by mouth daily.  . blood glucose meter kit and supplies KIT Check blood sugar before meals 3x/day for the first few days you are taking steroids  . Cholecalciferol (VITAMIN D-3) 25 MCG (1000 UT) CAPS Take 1 capsule by mouth daily.  . Dulaglutide 0.75 MG/0.5ML SOPN Inject 0.75 mg into the skin once a week.  . Empagliflozin-metFORMIN HCl (SYNJARDY) 5-500 MG TABS Take 1 tablet by mouth 2 (two) times daily.  Marland Kitchen glucose blood (ONETOUCH VERIO) test strip Check blood sugar 1 time per day  . Lancets (ONETOUCH DELICA PLUS [id number]) MISC Check blood sugar 1 time a day  . losartan (COZAAR) 100 MG tablet Take 1 tablet (100 mg total) by mouth daily.  Marland Kitchen SYNTHROID 125 MCG tablet TAKE 1 TABLET DAILY  . [DISCONTINUED] losartan (COZAAR) 50 MG tablet Take 1 tablet (50 mg total) by mouth daily.     Allergies:   Biotin and Carvedilol   Social History   Socioeconomic History  . Marital status: Divorced    Spouse name: Not on file  . Number of children: 1  . Years of education: 33  . Highest education level: Not on file  Occupational History  . Occupation: Teacher Asst  Tobacco Use  . Smoking status: Former  Smoker    Packs/day: 0.50    Years: 10.00    Pack years: 5.00    Types: Cigarettes    Quit date: 12/29/1980    Years since quitting: 40.2  . Smokeless tobacco: Never Used  . Tobacco comment: Quit 30 years ago  Vaping Use  . Vaping Use: Never used  Substance and Sexual Activity  . Alcohol use: Yes    Comment: rarely, maybe 3 times a year  . Drug use: No  . Sexual activity: Not on file  Other Topics Concern  . Not on file  Social History Narrative  . Not on file   Social Determinants of Health   Financial Resource Strain: Not on file  Food Insecurity: Not on file  Transportation Needs: Not on file   Physical Activity: Not on file  Stress: Not on file  Social Connections: Not on file     Family History: The patient's family history includes Breast cancer in her maternal aunt and sister; Colon cancer (age of onset: 61) in her father; Congestive Heart Failure in her paternal grandmother; Diabetes in her brother, father, and sister; Goiter in her father and mother; Heart disease in her maternal grandmother and sister; Kidney disease in her maternal uncle; Liver disease in her sister.  ROS:   Please see the history of present illness.    Review of Systems  Constitutional: Negative for chills, fever and malaise/fatigue.  HENT: Negative for hearing loss and nosebleeds.   Eyes: Negative for blurred vision and double vision.  Respiratory: Positive for shortness of breath. Negative for cough and hemoptysis.   Cardiovascular: Positive for palpitations. Negative for chest pain, orthopnea, claudication, leg swelling and PND.  Gastrointestinal: Negative for nausea and vomiting.  Genitourinary: Negative for dysuria and hematuria.  Musculoskeletal: Negative for falls and myalgias.  Neurological: Positive for dizziness. Negative for loss of consciousness, weakness and headaches.  Psychiatric/Behavioral: Negative for depression. The patient is not nervous/anxious.     EKGs/Labs/Other Studies Reviewed:    The following studies were reviewed today:  TTE 06/09/17: - Left ventricle: The cavity size was mildly dilated. Systolic  function was mildly reduced. The estimated ejection fraction was  in the range of 45% to 50%. Diffuse hypokinesis. Left ventricular  diastolic function parameters were normal.  - Mitral valve: There was mild regurgitation.  - Left atrium: The atrium was mildly dilated.  - Impressions: Abnormal GLS -12.5.  EKG:   03/05/2021: EKG is not ordered today.  Recent Labs: 09/26/2020: Hemoglobin 12.6; Platelets 518 11/26/2020: ALT 20; TSH 0.476 01/05/2021: BUN 18; Creatinine,  Ser 0.64; Potassium 3.9; Sodium 139  Recent Lipid Panel    Component Value Date/Time   CHOL 202 (H) 09/01/2020 1149   TRIG 106 09/01/2020 1149   HDL 56 09/01/2020 1149   CHOLHDL 3.6 09/01/2020 1149   LDLCALC 127 (H) 09/01/2020 1149      Physical Exam:    VS:  BP (!) 150/88   Pulse 91   Ht 5' 5"  (1.651 m)   Wt 242 lb (109.8 kg)   SpO2 95%   BMI 40.27 kg/m     Wt Readings from Last 3 Encounters:  03/05/21 242 lb (109.8 kg)  02/20/21 247 lb 9.6 oz (112.3 kg)  01/26/21 251 lb 1.6 oz (113.9 kg)     GEN: Well nourished, well developed in no acute distress HEENT: Normal NECK: No JVD; No carotid bruits LYMPHATICS: No lymphadenopathy CARDIAC: RRR, 1/6 systolic murmur, no rubs, no gallops RESPIRATORY:  Clear  to auscultation without rales, wheezing or rhonchi  ABDOMEN: Soft, non-tender, non-distended MUSCULOSKELETAL:  No edema; No deformity  SKIN: Warm and dry NEUROLOGIC:  Alert and oriented x 3 PSYCHIATRIC:  Normal affect   ASSESSMENT:    1. Shortness of breath   2. OSA (obstructive sleep apnea)   3. Hyperlipidemia, unspecified hyperlipidemia type   4. Medication management   5. Statin intolerance   6. Chronic systolic heart failure ([place])   7. Primary hypertension    PLAN:    In order of problems listed above:  #Dyspnea on exertion: Patient with progressive dyspnea on exertion over the past several months. Has several risk factors for underlying CAD including HTN, obesity, DMII , untreated HLD and family history. Also notably with mildly reduced LVEF 45-50% on previous TTE in 2018. She is currently scheduled for TTE, but will also pursue ischemic work-up at this time. -TTE scheduled -Obtain TTE -Will refer to lipid clinic for management of HLD given intolerance to statins  #Chronic Systolic Heart Failure with Mildly Reduced LVEF 45-50%: Patient noted to have mildly reduced LVEF 45-50% with GLS -12.5 on TTE in 2018. Has not had Cardiology follow-up. Had tongue  swelling with coreg. Repeat TTE pending. Ischemic work-up as above. -Ischemic work-up as above -Obtain TTE -Increase losartan to 168m daily as below -Has allergy to BB -Will add spiro/SGLT-2 inhibitor pending TTE -Low Na diet  #HTN: Elevated at 150 today. -Continue amlodipine 140mdaily -Increase losartan to 10041maily -Goal LDL<120s/70s  #DMII: Managed by PCP.  #HLD: #Statin intolerance: Intolerant to statins. Trialed multiple different medications. -Refer to lipid clinic for PCSK 9i  #OSA on CPAP: CPAP not currently working. Has reportedly severe OSA. May be contributing to symptoms. -Refer to Dr. TurRadford Paxeeds to resume CPAP   Shared Decision Making/Informed Consent The risks [chest pain, shortness of breath, cardiac arrhythmias, dizziness, blood pressure fluctuations, myocardial infarction, stroke/transient ischemic attack, nausea, vomiting, allergic reaction, radiation exposure, metallic taste sensation and life-threatening complications (estimated to be 1 in 10,000)], benefits (risk stratification, diagnosing coronary artery disease, treatment guidance) and alternatives of a nuclear stress test were discussed in detail with Ms. Alcock and she agrees to proceed.   Follow-up in 3 months.  Medication Adjustments/Labs and Tests Ordered: Current medicines are reviewed at length with the patient today.  Concerns regarding medicines are outlined above.  Orders Placed This Encounter  Procedures  . Basic metabolic panel  . AMB Referral to [facility] Pharm-D  . MYOCARDIAL PERFUSION IMAGING  . Split night study   Meds ordered this encounter  Medications  . losartan (COZAAR) 100 MG tablet    Sig: Take 1 tablet (100 mg total) by mouth daily.    Dispense:  90 tablet    Refill:  1    Dose increase    Patient Instructions  Medication Instructions:   INCREASE YOUR LOSARTAN TO 100 MG BY MOUTH DAILY  *If you need a refill on your cardiac medications before your next  appointment, please call your pharmacy*   Lab Work:  IN ONE WEEK--BMET  If you have labs (blood work) drawn today and your tests are completely normal, you will receive your results only by: . MMarland KitchenChart Message (if you have MyChart) OR . A paper copy in the mail If you have any lab test that is abnormal or we need to change your treatment, we will call you to review the results.   You have been referred to [facility]THER [facility]NAG[facility]   Testing/Procedures:  Your physician has recommended that you have a sleep study. This test records several body functions during sleep, including: brain activity, eye movement, oxygen and carbon dioxide blood levels, heart rate and rhythm, breathing rate and rhythm, the flow of air through your mouth and nose, snoring, body muscle movements, and chest and belly movement.  Your physician has requested that you have a lexiscan myoview. For further information please visit [email or web address]. Please follow instruction sheet, as given.   Follow-Up:  3 MONTHS IN THE OFFICE WITH EXTENDER OR DR. Johney Frame       I,Mathew Stumpf,acting as a scribe for Freada Bergeron, MD.,have documented all relevant documentation on the behalf of Freada Bergeron, MD,as directed by  Freada Bergeron, MD while in the presence of Freada Bergeron, MD.  I, Freada Bergeron, MD, have reviewed all documentation for this visit. The documentation on 03/05/21 for the exam, diagnosis, procedures, and orders are all accurate and complete.  Signed, Freada Bergeron, MD  03/05/2021 5:57 PM    [place]

## 2021-03-09 NOTE — Progress Notes (Signed)
Patient ID: Sally Wright                 DOB: [birth year]                    MRN: [id number]     HPI: EVERLY Wright is a 65 y.o. female patient referred to lipid clinic by Dr. Johney Frame. PMH is significant for chronic systolic heart failure with mildly reduced EF 45-50% (2018), mild atherosclerotic calcification (per CT angio 2018), anxiety, depression, DMII, HTN, HLD and OSA on CPAP.  Pt seen by Dr. Johney Frame on 5/12 and reported SOB for several months - mainly with exertion but can occur at rest as well. Additionally, reported previous use of atorvastatin and rosuvastatin which cause LE cramps and made her feel sick. Pt scheduled for TTE and referred to lipid clinic for PCSK9-inhibitor initiation.  Today, patient reports not currently taking any lipid-lowering medications. Reports taking rosuvastatin from March 2022 to April 2022 and discontinued due to joint pain in legs and hand. Reports multiple trials of atorvastatin in the past; could not recall exact side-effects but reports did not make her feel good. Discussed diet and exercise regimen below.   Current Medications: none  Intolerances: atorvastatin 20 mg daily and rosuvastatin 5 mg daily (LE cramps; joint pain) Risk Factors: DM, HTN, HLD, Fhx heart disease  LDL goal: <70 mg/dL  Diet:  -Late breakfast: smoothie, egg whites, bacon (3 strips), and grits with mushrooms, peppers and onions -Lunch: skips sometimes; sandwich -Dinner: vegetables, rice -Snacks: chips  Exercise: walking 15 minutes 5 days a week  Family History: CAD in her sister s/p DES x5; paternal grandmother also had heart disease. Congestive Heart Failure in her paternal grandmother; Diabetes in her brother, father, and sister; Goiter in her father and mother; Heart disease in her maternal grandmother and sister; Kidney disease in her maternal uncle; Liver disease in her sister.  Social History: Former Smoker (Quit: 12/29/1980 - 0.5 ppd x 10  years)  Labs: 09/01/20: LDL 127, TC 202, TG 106, HDL 56 (no therapy)  Past Medical History:  Diagnosis Date  . Anxiety   . Depression   . Diabetes mellitus without complication ([place]) 8453  . GERD (gastroesophageal reflux disease)   . Hyperlipidemia   . Hypertension   . Hypothyroidism   . Sleep apnea with use of continuous positive airway pressure (CPAP)     Current Outpatient Medications on File Prior to Visit  Medication Sig Dispense Refill  . acetaminophen (TYLENOL) 500 MG tablet Take 1,000 mg by mouth 2 (two) times daily.    Marland Kitchen amLODipine (NORVASC) 10 MG tablet Take 1 tablet (10 mg total) by mouth daily. 90 tablet 3  . blood glucose meter kit and supplies KIT Check blood sugar before meals 3x/day for the first few days you are taking steroids 1 each 0  . Cholecalciferol (VITAMIN D-3) 25 MCG (1000 UT) CAPS Take 1 capsule by mouth daily.    . Dulaglutide 0.75 MG/0.5ML SOPN Inject 0.75 mg into the skin once a week.    . Empagliflozin-metFORMIN HCl (SYNJARDY) 5-500 MG TABS Take 1 tablet by mouth 2 (two) times daily. 60 tablet 1  . glucose blood (ONETOUCH VERIO) test strip Check blood sugar 1 time per day 100 each 3  . Lancets (ONETOUCH DELICA PLUS [id number]) MISC Check blood sugar 1 time a day 100 each 3  . losartan (COZAAR) 100 MG tablet Take 1 tablet (100 mg total) by mouth daily. 90 tablet  1  . SYNTHROID 125 MCG tablet TAKE 1 TABLET DAILY 30 tablet 2   No current facility-administered medications on file prior to visit.    Allergies  Allergen Reactions  . Biotin Itching    OTC biotin   . Carvedilol     Tongue swelling    Assessment/Plan:  1. Hyperlipidemia - LDL above goal <70 mg/dL and currently not taking any lipid-lowering medication. History of statin intolerance to both atorvastatin and rosuvastatin. Discussed clinical benefits and potential side effects of PCSK9-inhibitors. Demonstrated proper administration injection technique with teach-back method. PA approved and  will start Praluent 75 mg subcutaneously every 2 weeks. Health-well grant application approved with $0 copay. Encouraged patient to aim for a diet full of vegetables, fruit and lean meats (chicken, [place], fish) and to limit carbs (bread, pasta, sugar, rice) and red meat consumption. Encouraged patient to exercise 20-30 minutes daily with the goal of 150 minutes per week. Patient verbalized understanding. Scheduled follow-up fasting lipid panel and LFTs in 3 months.   Lorel Monaco, PharmD, [facility] [address] [place], [place] [address] Phone: [phone]; Fax: [phone]

## 2021-03-10 ENCOUNTER — Other Ambulatory Visit: Payer: Self-pay

## 2021-03-10 ENCOUNTER — Other Ambulatory Visit: Payer: Medicare Other

## 2021-03-10 ENCOUNTER — Ambulatory Visit (INDEPENDENT_AMBULATORY_CARE_PROVIDER_SITE_OTHER): Payer: Medicare Other | Admitting: Pharmacist

## 2021-03-10 DIAGNOSIS — E785 Hyperlipidemia, unspecified: Secondary | ICD-10-CM | POA: Diagnosis not present

## 2021-03-10 DIAGNOSIS — G720 Drug-induced myopathy: Secondary | ICD-10-CM | POA: Diagnosis not present

## 2021-03-10 DIAGNOSIS — R0602 Shortness of breath: Secondary | ICD-10-CM

## 2021-03-10 DIAGNOSIS — T466X5A Adverse effect of antihyperlipidemic and antiarteriosclerotic drugs, initial encounter: Secondary | ICD-10-CM | POA: Diagnosis not present

## 2021-03-10 DIAGNOSIS — G4733 Obstructive sleep apnea (adult) (pediatric): Secondary | ICD-10-CM

## 2021-03-10 LAB — BASIC METABOLIC PANEL
BUN/Creatinine Ratio: 19 (ref 12–28)
BUN: 15 mg/dL (ref 8–27)
CO2: 24 mmol/L (ref 20–29)
Calcium: 10.3 mg/dL (ref 8.7–10.3)
Chloride: 100 mmol/L (ref 96–106)
Creatinine, Ser: 0.79 mg/dL (ref 0.57–1.00)
Glucose: 86 mg/dL (ref 65–99)
Potassium: 4.7 mmol/L (ref 3.5–5.2)
Sodium: 140 mmol/L (ref 134–144)
eGFR: 82 mL/min/{1.73_m2} (ref >59)

## 2021-03-10 MED ORDER — PRALUENT 75 MG/ML ~~LOC~~ SOAJ: 75.0000 mg | SUBCUTANEOUS | 11 refills | Status: DC

## 2021-03-10 NOTE — Patient Instructions (Signed)
Nice to see you today!  Keep up the good work with diet and exercise. Aim for a diet full of vegetables, fruit and lean meats (chicken, [place], fish). Try to limit carbs (bread, pasta, sugar, rice) and red meat consumption.  Your goal LDL <70 mg/dL, you're currently at 660 mg/dL  Medication Changes: Will plan to start Repatha or Praluent every 2 weeks. Once approved, will contact you once sent to your pharmacy  Ozempic (semaglutide) for diabetes management and also had great weight loss data  Please give [place] a call at [phone] with any questions or concerns.  For PCSK9i, inject once every other week (any day of the week that works for you) into the fatty skin of stomach, upper outer thigh or back of the arm. Clean the site with soap and warm water or an alcohol pad. Keep the medication in the fridge until you are ready to give your dose, then take it out and let warm up to room temperature for 30-60 mins.

## 2021-03-11 ENCOUNTER — Telehealth: Payer: Self-pay | Admitting: Cardiology

## 2021-03-11 NOTE — Telephone Encounter (Signed)
The patient has been notified of the result and verbalized understanding.  All questions (if any) were answered. Loa Socks, LPN 12/18/4973 30:05 AM

## 2021-03-11 NOTE — Telephone Encounter (Signed)
llow up:    Patient returning a call back concerning some results. please advise.

## 2021-03-11 NOTE — Telephone Encounter (Signed)
-----   Message from Meriam Sprague, MD sent at 03/10/2021  5:41 PM EDT ----- Kidney function and electrolytes look good. No changes in medications.

## 2021-03-12 ENCOUNTER — Ambulatory Visit (HOSPITAL_COMMUNITY)
Admission: RE | Admit: 2021-03-12 | Discharge: 2021-03-12 | Disposition: A | Discharge status: Home / Self Care | Payer: Medicare Other | Source: Ambulatory Visit | Attending: Internal Medicine | Admitting: Internal Medicine

## 2021-03-12 ENCOUNTER — Other Ambulatory Visit: Payer: Self-pay

## 2021-03-12 DIAGNOSIS — E119 Type 2 diabetes mellitus without complications: Secondary | ICD-10-CM | POA: Insufficient documentation

## 2021-03-12 DIAGNOSIS — E785 Hyperlipidemia, unspecified: Secondary | ICD-10-CM | POA: Diagnosis not present

## 2021-03-12 DIAGNOSIS — I119 Hypertensive heart disease without heart failure: Secondary | ICD-10-CM | POA: Insufficient documentation

## 2021-03-12 DIAGNOSIS — R0600 Dyspnea, unspecified: Secondary | ICD-10-CM | POA: Insufficient documentation

## 2021-03-12 DIAGNOSIS — R0602 Shortness of breath: Secondary | ICD-10-CM

## 2021-03-12 DIAGNOSIS — G4730 Sleep apnea, unspecified: Secondary | ICD-10-CM | POA: Diagnosis not present

## 2021-03-12 LAB — ECHOCARDIOGRAM COMPLETE
AR max vel: 1.93 cm2
AV Peak grad: 11.7 mmHg
Ao pk vel: 1.71 m/s
Area-P 1/2: 5.06 cm2
S' Lateral: 3.2 cm
Single Plane A4C EF: 73.4 %

## 2021-03-12 NOTE — Progress Notes (Signed)
  Echocardiogram 2D Echocardiogram has been performed.  Sally Wright 03/12/2021, 9:23 AM

## 2021-03-13 NOTE — Telephone Encounter (Signed)
Patient is scheduled for lab study on 05-18-21. Patient understands her sleep study will be done at [facility] sleep lab. Patient understands she will receive a sleep packet in a week or so. Patient understands to call if she does not receive the sleep packet in a timely manner. Patient agrees with treatment and thanked me for call.  

## 2021-03-14 ENCOUNTER — Ambulatory Visit
Admission: RE | Admit: 2021-03-14 | Discharge: 2021-03-14 | Disposition: A | Discharge status: Home / Self Care | Payer: Medicare Other | Source: Ambulatory Visit | Attending: Internal Medicine | Admitting: Internal Medicine

## 2021-03-14 ENCOUNTER — Other Ambulatory Visit: Payer: Self-pay

## 2021-03-14 ENCOUNTER — Ambulatory Visit: Payer: Medicare Other

## 2021-03-16 ENCOUNTER — Ambulatory Visit: Payer: Medicare Other

## 2021-03-16 ENCOUNTER — Telehealth: Payer: Self-pay | Admitting: *Deleted

## 2021-03-16 NOTE — Telephone Encounter (Signed)
-----   Message from Reesa Chew, CMA sent at 03/16/2021  3:36 PM EDT ----- Regarding: RE: NEEDS TO ESTABLISH WITH [facility] FOR OSA 05-18-21 ----- Message ----- From: Loa Socks, LPN Sent: 03/31/3015   2:14 PM EDT To: Gaynelle Cage, CMA, Reesa Chew, CMA, # Subject: NEEDS TO ESTABLISH WITH [facility] FOR OSA         This pt has known OSA and has an old and recalled device from out [place] state Provider.  She needs another sleep study and to establish with [facility]. Sleep study order in epic.  Pt is aware you will call to coordinate.  Can you please arrange and shoot me the date?   Thanks, [employer]

## 2021-03-19 ENCOUNTER — Telehealth (HOSPITAL_COMMUNITY): Payer: Self-pay | Admitting: *Deleted

## 2021-03-19 NOTE — Telephone Encounter (Signed)
Patient given detailed instructions per Myocardial Perfusion Study Information Sheet for the test on 03/26/2021 at 1:00. Patient notified to arrive 15 minutes early and that it is imperative to arrive on time for appointment to keep from having the test rescheduled.  If you need to cancel or reschedule your appointment, please call the office within 24 hours of your appointment. . Patient verbalized understanding.Sally Wright

## 2021-03-26 ENCOUNTER — Other Ambulatory Visit: Payer: Self-pay | Admitting: Student

## 2021-03-26 ENCOUNTER — Other Ambulatory Visit: Payer: Self-pay

## 2021-03-26 ENCOUNTER — Ambulatory Visit (HOSPITAL_COMMUNITY): Payer: Medicare Other | Attending: Internal Medicine

## 2021-03-26 DIAGNOSIS — R0602 Shortness of breath: Secondary | ICD-10-CM | POA: Diagnosis not present

## 2021-03-26 DIAGNOSIS — E785 Hyperlipidemia, unspecified: Secondary | ICD-10-CM | POA: Diagnosis not present

## 2021-03-26 DIAGNOSIS — E119 Type 2 diabetes mellitus without complications: Secondary | ICD-10-CM

## 2021-03-26 MED ORDER — SYNJARDY 5-500 MG PO TABS: 1.0000 | ORAL_TABLET | Freq: Two times a day (BID) | ORAL | 1 refills | Status: DC

## 2021-03-26 MED ORDER — REGADENOSON 0.4 MG/5ML IV SOLN
0.4000 mg | Freq: Once | INTRAVENOUS | Status: AC
  Administered 2021-03-26: 0.4 mg via INTRAVENOUS

## 2021-03-26 MED ORDER — TECHNETIUM TC 99M TETROFOSMIN IV KIT
32.6000 | PACK | Freq: Once | INTRAVENOUS | Status: AC | PRN
  Administered 2021-03-26: 32.6 via INTRAVENOUS
  Filled 2021-03-26: qty 33

## 2021-03-27 ENCOUNTER — Ambulatory Visit (HOSPITAL_COMMUNITY): Payer: Medicare Other | Attending: Cardiovascular Disease

## 2021-03-27 LAB — MYOCARDIAL PERFUSION IMAGING
LV dias vol: 97 mL (ref 46–106)
LV sys vol: 39 mL
Peak HR: 110 {beats}/min
Rest HR: 90 {beats}/min
SDS: 0
SRS: 0
SSS: 0
TID: 1.02

## 2021-03-27 MED ORDER — TECHNETIUM TC 99M TETROFOSMIN IV KIT
30.5000 | PACK | Freq: Once | INTRAVENOUS | Status: AC | PRN
  Administered 2021-03-27: 30.5 via INTRAVENOUS
  Filled 2021-03-27: qty 31

## 2021-04-02 ENCOUNTER — Ambulatory Visit (INDEPENDENT_AMBULATORY_CARE_PROVIDER_SITE_OTHER): Payer: Medicare Other | Admitting: Internal Medicine

## 2021-04-02 ENCOUNTER — Ambulatory Visit (INDEPENDENT_AMBULATORY_CARE_PROVIDER_SITE_OTHER): Payer: Medicare Other | Admitting: Dietician

## 2021-04-02 VITALS — BP 134/76 | HR 85 | Temp 98.3°F | Ht 65.0 in | Wt 238.0 lb

## 2021-04-02 DIAGNOSIS — R252 Cramp and spasm: Secondary | ICD-10-CM | POA: Diagnosis present

## 2021-04-02 DIAGNOSIS — E119 Type 2 diabetes mellitus without complications: Secondary | ICD-10-CM

## 2021-04-02 NOTE — Progress Notes (Signed)
New PCP patient visit for me with Sally Wright (wants to be called Sally Bible) though she has been seen in our clinic before.  Was established here years ago, moved away, and returned 07/2020.  She has recently been undergoing a cardiac evaluation including echo and stress testing which were reassuring.    She had been experiencing exertional dyspnea without chest pain ever since developing a Covid-like illness at the beginning of the pandemic (prior to available testing).  She was quite ill at the time though didn't require hospitalization.  She feels as though her breathing and exertional tolerance had never returned to baseline. At one point she was prescribed a nebulizer unit which she hasn't used as the medicine caused anxiety and tremulousness.  Recently she has developed frequent nighttime cramps in her feet and calves which "bring me up out of the bed".  Timing corresponds to initiation of Praluent (prescribed by cardiology pharmacist as [name] has had statin intolerance in the past), and to an increase in losartan from 50 mg daily to 100 mg daily.  She has been feeling well otherwise. She questions whether the new medicine (an injection is planned for today) may be causing this as a side effect.  Cardiologist also ordered a new sleep study through [facility] scheduled for 05/18/21.    Prevention/health maintenance:  mammogram 02/2021 completed.  Colonoscopy nml 2015 in [place] (report under procedures tab).  Radiographic aortic atherosclerosis.    A1c has improved to 6.8 from 9.8! PFTs 11/2020 nml FEV1, some air trapping, nml airway resistance, no response to bronchodilator.

## 2021-04-02 NOTE — Patient Instructions (Signed)
Wonderful to meet you today! We discussed many things, including your diabetes (we will check into the alternative to Trulicity which might help more with weight loss; check your A1C at end of July); high blood pressure (doing better on the losartan 100 mg daily), your sleep apnea (sleep study is scheduled 05/18/21), your cramping legs at night (check electrolytes, and skip today's injection to see if you get better), L knee pain which is making exercise uncomfortable, and our desire to work together on a holistic plan for your ongoing wellness!  Take care and stay well!  Dr. Mayford Knife

## 2021-04-02 NOTE — Progress Notes (Signed)
Medical Nutrition Therapy :  Appt start time: 1105 am end time:  1150. Total time: 45 Visit # 3 Assessment:  Primary concerns today: diabetes follow up,  lipid and hypertension meal planning and weight loss Sally Wright wants to decrease her weight, lipids and blood pressure so she can eventually decrease some of her medicines. She feels she is doing well at making changes ( using [place] sausage now and limiting sodium, eating cookie instead of chips because she can control how much she eats better) and gains more knowledge with every visit. She is happy her weight is now under 240#. Her weight is decreased 16# in 3 months which is appropriate (4-8#/month). She is working on increasing her activity that  does not hurt her knee.  ANTHROPOMETRICS: Estimated body mass index is 39.61 kg/m as calculated from the following:   Height as of an earlier encounter on 04/02/21: 5\' 5"  (1.651 m).   Weight as of an earlier encounter on 04/02/21: 238 lb (108 kg).  WEIGHT HISTORY: she thinks the 247# was a mistake. Wt Readings from Last 10 Encounters:  04/02/21 238 lb (108 kg)  03/26/21 242 lb (109.8 kg)  03/05/21 242 lb (109.8 kg)  02/20/21 247 lb 9.6 oz (112.3 kg)  01/26/21 251 lb 1.6 oz (113.9 kg)  01/05/21 254 lb (115.2 kg)  12/29/20 247 lb 6 oz (112.2 kg)  12/22/20 253 lb 6.4 oz (114.9 kg)  11/26/20 258 lb 9.6 oz (117.3 kg)  10/02/20 253 lb 6.4 oz (114.9 kg)   MEDICATIONS: synjardy, Trulicity 0.75mg  weekly and changing to Ozempic today BLOOD SUGAR:  Lab Results  Component Value Date   HGBA1C 6.8 (A) 02/20/2021   HGBA1C 9.8 (A) 11/26/2020   HGBA1C 8.9 (A) 09/01/2020   HGBA1C 7.1 04/28/2017   HGBA1C 6.4 12/07/2016     DIETARY INTAKE: Usual eating pattern includes 2 meals and 1 snack per day.  Dining Out (times/week): seldom avoids lactose .  1 (10-12 AM): smoothie- ~ 1c.berries , 1 scoop protein powder, spinach, a few crackers or 1/2 c eggs whites, mushrooms, onion, peppers and toast x2 with  butter 2 ( 5-8 PM): chicken thigh with skin, potato or rice made with low sodium broth, collard greens with no fat 3-Snk (11 PM):  pudding and fruit Beverages: 2-3 64 oz water/day,. Occasionally unsweet green tea  Usual physical activity: does a utube fitness program  Estimated daily  energy needs: 1700-2100 calories for weight loss   Progress Towards Goal(s):  Some progress.   Nutritional Diagnosis:  NB-1.1 Food and nutrition-related knowledge deficit As related to lack of education and support for weight loss is improving.  As evidenced by her report and questions.    Intervention:  Nutrition education about lipid, blood sugar and blood pressure  lowering meal planning. Stressed adequate protein, fiber Action Goal: try eating a balanced meal in PM instead of a snack  Outcome goal: increased knowledge of how much she eats and how weight loss is achieved.  Coordination of care: titrate GLP-1 as indicated for desired weight loss  Teaching Method Utilized: Visual, Auditory,Hands on Handouts given during visit include: After visit summary  Barriers to learning/adherence to lifestyle change: competing values Demonstrated degree of understanding via:  Teach Back   Monitoring/Evaluation:  Dietary intake, exercise, meter, and body weight in 6 week(s). 12/09/2016 Sally Wright, RD 04/02/2021 10:45 AM.

## 2021-04-02 NOTE — Patient Instructions (Addendum)
You are doing a great job lowering your blood sugar, blood pressure and weight.   Keep it up and it should lead to your goal of eventually decreasing your medicines!  I suggest we follow up in 6-8 weeks to continue to monitor your progress and answer your questions.    Sally Wright [phone]

## 2021-04-03 ENCOUNTER — Telehealth: Payer: Self-pay | Admitting: Internal Medicine

## 2021-04-03 LAB — BMP8+ANION GAP
Anion Gap: 19 mmol/L — ABNORMAL HIGH (ref 10.0–18.0)
BUN/Creatinine Ratio: 26 (ref 12–28)
BUN: 18 mg/dL (ref 8–27)
CO2: 21 mmol/L (ref 20–29)
Calcium: 9.7 mg/dL (ref 8.7–10.3)
Chloride: 101 mmol/L (ref 96–106)
Creatinine, Ser: 0.69 mg/dL (ref 0.57–1.00)
Glucose: 78 mg/dL (ref 65–99)
Potassium: 4.1 mmol/L (ref 3.5–5.2)
Sodium: 141 mmol/L (ref 134–144)
eGFR: 96 mL/min/{1.73_m2} (ref >59)

## 2021-04-03 NOTE — Telephone Encounter (Signed)
Attempted to reach pt to convey normal lab results.  I did not leave mssg.  Called cell and also a [phone] number which is not in service.

## 2021-04-22 ENCOUNTER — Telehealth: Payer: Self-pay

## 2021-04-22 ENCOUNTER — Other Ambulatory Visit: Payer: Self-pay | Admitting: *Deleted

## 2021-04-22 NOTE — Telephone Encounter (Signed)
Submitted application for OZEMPIC to [employer] for patient assistance.   Phone: [phone]  

## 2021-04-23 MED ORDER — LEVOTHYROXINE SODIUM 125 MCG PO TABS: 125.0000 ug | ORAL_TABLET | Freq: Every day | ORAL | 3 refills | Status: AC

## 2021-04-28 ENCOUNTER — Encounter: Payer: Self-pay | Admitting: *Deleted

## 2021-05-08 ENCOUNTER — Ambulatory Visit: Payer: Medicare Other

## 2021-05-13 ENCOUNTER — Telehealth: Payer: Self-pay | Admitting: Dietician

## 2021-05-13 ENCOUNTER — Ambulatory Visit: Payer: Medicare Other | Admitting: Dietician

## 2021-05-13 NOTE — Telephone Encounter (Signed)
Called patient about missed appointment today. She said she had cancelled it last week. She agreed to call back to reschedule it on the same day she sees her doctor.

## 2021-05-15 ENCOUNTER — Telehealth: Payer: Self-pay | Admitting: Internal Medicine

## 2021-05-15 ENCOUNTER — Encounter: Payer: Self-pay | Admitting: Internal Medicine

## 2021-05-15 DIAGNOSIS — E890 Postprocedural hypothyroidism: Secondary | ICD-10-CM

## 2021-05-15 DIAGNOSIS — E119 Type 2 diabetes mellitus without complications: Secondary | ICD-10-CM
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
## 2021-05-15 NOTE — Telephone Encounter (Signed)
Pt called / informed of Dr Mayford Knife' response about the letter and labs. Very appreciative. Pt schedule the lab appt for 06/01/21.
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
## 2021-05-18 ENCOUNTER — Other Ambulatory Visit: Payer: Self-pay

## 2021-05-18 ENCOUNTER — Ambulatory Visit (HOSPITAL_BASED_OUTPATIENT_CLINIC_OR_DEPARTMENT_OTHER): Payer: Medicare Other | Attending: Cardiology | Admitting: Cardiology

## 2021-05-18 DIAGNOSIS — G4734 Idiopathic sleep related nonobstructive alveolar hypoventilation: Secondary | ICD-10-CM | POA: Diagnosis not present

## 2021-05-18 DIAGNOSIS — E119 Type 2 diabetes mellitus without complications: Secondary | ICD-10-CM | POA: Insufficient documentation

## 2021-05-18 DIAGNOSIS — Z6841 Body Mass Index (BMI) 40.0 and over, adult: Secondary | ICD-10-CM | POA: Insufficient documentation

## 2021-05-18 DIAGNOSIS — I10 Essential (primary) hypertension: Secondary | ICD-10-CM | POA: Diagnosis present

## 2021-05-18 DIAGNOSIS — G4736 Sleep related hypoventilation in conditions classified elsewhere: Secondary | ICD-10-CM | POA: Insufficient documentation

## 2021-05-18 DIAGNOSIS — E669 Obesity, unspecified: Secondary | ICD-10-CM | POA: Insufficient documentation

## 2021-05-18 DIAGNOSIS — G4733 Obstructive sleep apnea (adult) (pediatric): Secondary | ICD-10-CM | POA: Diagnosis not present

## 2021-05-26 ENCOUNTER — Telehealth: Payer: Self-pay

## 2021-05-26 NOTE — Telephone Encounter (Signed)
Informed pt that medication is ready for pickup. Says she can pickup medication on Monday at her appt. Has questions about transitioning from trulicity to ozempic. Also wants to know how much to take.  Please add medication to pt med list as well.

## 2021-05-28 NOTE — Procedures (Signed)
   Patient Name: Sally, Wright Date:05/18/2021 Gender: Female D.O.B: 10/09/1956 Age (years): 24 Referring Provider: Laurance Flatten MD Height (inches): 65 Interpreting Physician: Armanda Magic MD, ABSM Weight (lbs): 240 RPSGT: Shelah Lewandowsky BMI: 40 MRN: [id number] Neck Size: 14.00  CLINICAL INFORMATION Sleep Study Type: NPSG  Indication for sleep study: Diabetes, Hypertension, Obesity  Epworth Sleepiness Score: 11  Most recent polysomnogram dated 02/29/2020 revealed an AHI of 14.6/h. Most recent titration study dated 07/24/2020 was optimal at 8cm H2O with an AHI of 0/h.  SLEEP STUDY TECHNIQUE As per the AASM Manual for the Scoring of Sleep and Associated Events v2.3 (April 2016) with a hypopnea requiring 4% desaturations.  The channels recorded and monitored were frontal, central and occipital EEG, electrooculogram (EOG), submentalis EMG (chin), nasal and oral airflow, thoracic and abdominal wall motion, anterior tibialis EMG, snore microphone, electrocardiogram, and pulse oximetry.  MEDICATIONS Medications self-administered by patient taken the night of the study : ACETAMINOPHEN  SLEEP ARCHITECTURE The study was initiated at 9:58:48 PM and ended at 4:31:37 AM.  Sleep onset time was 10.2 minutes and the sleep efficiency was 72.0%. The total sleep time was 283 minutes.  Stage REM latency was 85.5 minutes.  The patient spent 7.8% of the night in stage N1 sleep, 78.6% in stage N2 sleep, 0.0% in stage N3 and 13.6% in REM.  Alpha intrusion was absent.  Supine sleep was 17.48%.  RESPIRATORY PARAMETERS The overall apnea/hypopnea index (AHI) was 7.6 per hour. There were 1 total apneas, including 1 obstructive, 0 central and 0 mixed apneas. There were 35 hypopneas and 15 RERAs.  The AHI during Stage REM sleep was 53.0 per hour.  AHI while supine was 32.7 per hour.  The mean oxygen saturation was 90.6%. The minimum SpO2 during sleep was 74.0%.  moderate snoring  was noted during this study.  CARDIAC DATA The 2 lead EKG demonstrated sinus rhythm. The mean heart rate was 64.4 beats per minute. Other EKG findings include: PVCs, PACs and ventricular couplets.  LEG MOVEMENT DATA The total PLMS were 0 with a resulting PLMS index of 0.0. Associated arousal with leg movement index was 0.0 .  IMPRESSIONS - Mild obstructive sleep apnea occurred during this study (AHI = 7.6/hr) overall but severe obstructive sleep apnea during REM sleep (AHI REM = 53/hr). - Moderate oxygen desaturation was noted during this study (Min O2 = 74.0%). - The patient snored with moderate snoring volume. - PACs, PVCs and ventricular couplets were noted during this study. - Clinically significant periodic limb movements did not occur during sleep. No significant associated arousals.  DIAGNOSIS - Obstructive Sleep Apnea (G47.33) - Nocturnal Hypoxemia (G47.36)  RECOMMENDATIONS - Recommend in lab CPAP titration due to severity of AHI during REM sleep and nocturnal hypoxemia.  - Positional therapy avoiding supine position during sleep. - Avoid alcohol, sedatives and other CNS depressants that may worsen sleep apnea and disrupt normal sleep architecture. - Sleep hygiene should be reviewed to assess factors that may improve sleep quality. - Weight management and regular exercise should be initiated or continued if appropriate.  [Electronically signed] 05/28/2021 06:05 PM  Armanda Magic MD, ABSM Diplomate, American Board of Sleep Medicine

## 2021-06-01 ENCOUNTER — Other Ambulatory Visit: Payer: Medicare Other

## 2021-06-01 ENCOUNTER — Ambulatory Visit (INDEPENDENT_AMBULATORY_CARE_PROVIDER_SITE_OTHER): Payer: Medicare Other | Admitting: Dietician

## 2021-06-01 ENCOUNTER — Encounter: Payer: Self-pay | Admitting: Dietician

## 2021-06-01 DIAGNOSIS — E119 Type 2 diabetes mellitus without complications: Secondary | ICD-10-CM | POA: Diagnosis present

## 2021-06-01 DIAGNOSIS — E890 Postprocedural hypothyroidism: Secondary | ICD-10-CM

## 2021-06-01 LAB — POCT GLYCOSYLATED HEMOGLOBIN (HGB A1C): Hemoglobin A1C: 5.8 % — AB (ref 4.0–5.6)

## 2021-06-01 LAB — GLUCOSE, CAPILLARY: Glucose-Capillary: 74 mg/dL (ref 70–99)

## 2021-06-01 NOTE — Telephone Encounter (Signed)
Patient was provided with Ozempic 4 mg/58mL pens today from Patient Assistance and directed to take 0.5 mg weekly (37 clicks on her 1 mg pens) instead of Trulicity 0.75 per Rachelle's recommendation. She agreed to switch to Ozempic this Sunday. Can someone please update her medication list? Thank you!

## 2021-06-01 NOTE — Patient Instructions (Addendum)
Ideas to help you eat healthy when stressing: - healthy pop or healthy choice popcorn, Act II may also have a light butter popcorn - wheat chex w/ apple cinnamon - rice cakes with peanut butter  Your goal is to check blood sugar at least one time a day  Before eating goal is <130 After eating goal (1-2 hours after) is< 180.  If more than that- think about how you can change your meal that you ate to lower your after meal blood sugar  Carbs increase blood sugar.  Carbs include starchy foods and sugary foods-   For Cookies-nuts or seeds Wasa crackers, Triscuits  We can follow up 2-3 months- October- November.   Lupita Leash [phone]

## 2021-06-01 NOTE — Progress Notes (Signed)
   Medical Nutrition Therapy :  Appt start time: 1318 end time:  1417        Total time: 59 Visit # 4  Assessment:  Primary concerns today: diabetes follow up,  lipid and hypertension meal planning and weight loss Sally Wright wants to decrease her weight, lipids and blood pressure so she can eventually decrease some of her medicines.   She feels she is not doing as well for the past month. She states she has been stress eating potato chips, cookies because of her living situation. She is not sleeping as well. She is trying to get an alternative place to live.  She "fell off the wagon" in regards to checking her blood sugar daily as well. She is eating balanced meals  She is being transitioned to Ozempic 0.5 mg and was educated about it today  ANTHROPOMETRICS: Estimated body mass index is 39.62 kg/m as calculated from the following:   Height as of 05/18/21: 5\' 5"  (1.651 m).   Weight as of this encounter: 238 lb 1.6 oz (108 kg).  WEIGHT HISTORY: she thinks the 247# was a mistake. Wt Readings from Last 10 Encounters:  06/01/21 238 lb 1.6 oz (108 kg)  05/18/21 240 lb (108.9 kg)  04/02/21 238 lb (108 kg)  03/26/21 242 lb (109.8 kg)  03/05/21 242 lb (109.8 kg)  02/20/21 247 lb 9.6 oz (112.3 kg)  01/26/21 251 lb 1.6 oz (113.9 kg)  01/05/21 254 lb (115.2 kg)  12/29/20 247 lb 6 oz (112.2 kg)  12/22/20 253 lb 6.4 oz (114.9 kg)   MEDICATIONS: synjardy, Trulicity 0.75mg  weekly and changing to Ozempic 0.5 mg this Sunday.   BLOOD SUGAR:  Lab Results  Component Value Date   HGBA1C 5.8 (A) 06/01/2021   HGBA1C 6.8 (A) 02/20/2021   HGBA1C 9.8 (A) 11/26/2020   HGBA1C 8.9 (A) 09/01/2020   HGBA1C 7.1 04/28/2017    BP Readings from Last 3 Encounters:  04/02/21 134/76  03/05/21 (!) 150/88  02/20/21 (!) 151/89    DIETARY INTAKE: Usual eating pattern includes 2 meals and 1 snack per day.  Dining Out (times/week): seldom avoids lactose. Feels she eats plenty of fruit. Most time today spent on  her feelings about stress eating.  Usual physical activity: does a utube fitness program  Estimated daily  energy needs: 1700-2100 calories for weight loss   Progress Towards Goal(s):  Some progress.   Nutritional Diagnosis:  NB-1.1 Food and nutrition-related knowledge deficit As related to lack of education and support for weight loss is improving.  As evidenced by her report and questions.    Intervention:  Nutrition education about  how to handel emotional eating/stress eating and using self monitoring to help her get back on track Action Goal:   Outcome goal: increased knowledge of how much she eats and how weight loss is achieved.  Coordination of care: titrate GLP-1 as indicated for desired weight loss  Teaching Method Utilized: Visual, Auditory,Hands on Handouts given during visit include: After visit summary  Barriers to learning/adherence to lifestyle change: competing values Demonstrated degree of understanding via:  Teach Back   Monitoring/Evaluation:  Dietary intake, exercise, meter, and body weight in 6 week(s). 02/22/21, RD 06/01/2021 2:44 PM.

## 2021-06-02 LAB — TSH: TSH: 1.67 u[IU]/mL (ref 0.450–4.500)

## 2021-06-04 ENCOUNTER — Encounter: Payer: Self-pay | Admitting: Internal Medicine

## 2021-06-04 ENCOUNTER — Other Ambulatory Visit: Payer: Self-pay

## 2021-06-04 ENCOUNTER — Ambulatory Visit (INDEPENDENT_AMBULATORY_CARE_PROVIDER_SITE_OTHER): Payer: Medicare Other | Admitting: Internal Medicine

## 2021-06-04 DIAGNOSIS — M7989 Other specified soft tissue disorders: Secondary | ICD-10-CM | POA: Diagnosis present

## 2021-06-04 NOTE — Assessment & Plan Note (Signed)
Patient presents today for evaluation left armpit swelling noticed after receiving her second COVID booster shot.  States she noticed some discomfort last night.  Denies any overlying skin changes, erythema or rash.  Denies any fever, chills, nausea or vomiting.  She has been taking Tylenol for pain.  On exam there is notable left armpit swelling but no obvious lymphadenopathy or overlying skin changes.  Reassured patient that left armpit swelling is a known side effect to the COVID vaccination.  To return to clinic if symptoms persist or worsen.

## 2021-06-04 NOTE — Patient Instructions (Signed)
Your left armpit swelling is a known side effect of the COVID vaccination.  Let me know if your swelling does not improve over the course the next few weeks.

## 2021-06-04 NOTE — Progress Notes (Signed)
   CC: Left armpit swelling  HPI:  [name] is a 65 y.o. with a past medical history listed below presenting for left armpit swelling. For details of today's visit and the status of his chronic medical issues please refer to the assessment and plan.   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 She had a brother with TB related to AIDS treated in the 1980s   Review of Systems:   Negative except as per assessment and plan  Physical Exam:  Vitals:   06/04/21 1443  BP: (!) 158/84  Pulse: 98  Temp: 98.1 F (36.7 C)  TempSrc: Oral  SpO2: 96%  Weight: 237 lb 12.8 oz (107.9 kg)  Height: 5\' 5"  (1.651 m)   Physical Exam General: alert, appears stated age, in no acute distress HEENT: Normocephalic, atraumatic, EOM intact, conjunctiva normal CV: Regular rate and rhythm, no murmurs rubs or gallops Pulm: Clear to auscultation bilaterally, normal work of breathing Abdomen: Soft, nondistended, bowel sounds present, no tenderness to palpation MSK: No lower extremity edema Skin: Warm and dry, notable left armpit swelling tender to deep palpation Neuro: Alert and oriented x3   Assessment & Plan:   See Encounters Tab for problem based charting.  Patient discussed with Dr. 

## 2021-06-08 ENCOUNTER — Telehealth: Payer: Self-pay | Admitting: *Deleted

## 2021-06-08 NOTE — Telephone Encounter (Signed)
Patient calling for sleep study results. °

## 2021-06-09 NOTE — Progress Notes (Signed)
Internal Medicine Clinic Attending ° °Case discussed with Dr. Rehman  At the time of the visit.  We reviewed the resident’s history and exam and pertinent patient test results.  I agree with the assessment, diagnosis, and plan of care documented in the resident’s note.  ° °

## 2021-06-10 ENCOUNTER — Other Ambulatory Visit: Payer: Medicare Other

## 2021-06-11 ENCOUNTER — Telehealth: Payer: Self-pay | Admitting: Pharmacist

## 2021-06-11 NOTE — Telephone Encounter (Signed)
Called pt to follow up with lipids, she was supposed to have labs checked yesterday on Praluent therapy, however she canceled appt and note states "not needed because other labs are good."  Called pt to see if another provider has checked her cholesterol since starting Praluent. She states she stopped taking this after 2 injections due to cramping in her legs. She did not call our office to let us know that she was experiencing side effects or that she stopped the medication.   Advised pt that her cholesterol is not well controlled; her LDL is 127 at baseline and her goal is < 70 due to coronary calcifications and DM. She is previously intolerant to atorvastatin 20 mg daily and rosuvastatin 5 mg daily (muscle cramping and joint pain).  Pt states she does not want to take medication for her cholesterol. Offered her the option of trying either ezetimibe or Nexlizet which she declined. Advised pt to call back if she changes her mind. Med list and allergies have been updated.

## 2021-06-17 ENCOUNTER — Telehealth: Payer: Self-pay | Admitting: *Deleted

## 2021-06-17 ENCOUNTER — Ambulatory Visit: Payer: Medicare Other | Admitting: Physician Assistant

## 2021-06-17 DIAGNOSIS — G4733 Obstructive sleep apnea (adult) (pediatric): Secondary | ICD-10-CM

## 2021-06-17 NOTE — Telephone Encounter (Addendum)
Called results : they have sleep apnea.  Recommend therapeutic CPAP titration for treatment of patient's sleep disordered breathing.  Left detailed message on voicemail and informed patient to call back with questions.

## 2021-06-17 NOTE — Telephone Encounter (Signed)
Informed patient of sleep study results and patient understanding was verbalized. Patient understands her sleep study showed they have sleep apnea.  Recommend therapeutic CPAP titration for treatment of patient's sleep disordered breathing.   Titration to be precerted

## 2021-06-17 NOTE — Telephone Encounter (Signed)
-----   Message from Quintella Reichert, MD sent at 05/28/2021  6:08 PM EDT ----- Please let patient know that they have sleep apnea.  Recommend therapeutic CPAP titration for treatment of patient's sleep disordered breathing.  If unable to perform an in lab titration then initiate ResMed auto CPAP from 4 to 15cm H2O with heated humidity and mask of choice and overnight pulse ox on CPAP.

## 2021-06-18 NOTE — Telephone Encounter (Signed)
Patient understands her sleep study showed they have sleep apnea and recommend CPAP titration. Please set up titration in the sleep lab ASAP. Pt is aware of her/his results   Patient states she has moved out of state and she will get a sleep doctor in [place] and she will send back for her records to go the new doctor.

## 2021-07-01 NOTE — Progress Notes (Signed)
Called & cancelled refills with [facility] nordisk for pt's ozempic. Pt has moved to another state and is aware she will need to move her enrollment to her new PCP office once estalished.   She has enough pens to last her until the end of November/beginning of December.

## 2021-07-06 NOTE — Progress Notes (Signed)
Thanks you for letting me know. I'll ask Sally Wright to address her PCP status.

## 2021-07-14 NOTE — Telephone Encounter (Signed)
Patient is scheduled for lab study on 08/13/21. Patient understands the sleep study will be done at [facility] sleep lab. Patient understands she will receive a sleep packet in a week or so. Patient understands to call if she does not receive the sleep packet in a timely manner.  Patient declined testing because she has moved out of state. She understands she needs to test before 1 year has elapsed so that she does not have to start over with a whole new study.

## 2021-07-16 ENCOUNTER — Ambulatory Visit (INDEPENDENT_AMBULATORY_CARE_PROVIDER_SITE_OTHER): Payer: Medicare Other | Admitting: Internal Medicine

## 2021-07-16 ENCOUNTER — Telehealth: Payer: Self-pay | Admitting: Internal Medicine

## 2021-07-16 ENCOUNTER — Encounter: Payer: Medicare Other | Admitting: Internal Medicine

## 2021-07-16 DIAGNOSIS — G8929 Other chronic pain: Secondary | ICD-10-CM

## 2021-07-16 DIAGNOSIS — M25512 Pain in left shoulder: Secondary | ICD-10-CM

## 2021-07-16 DIAGNOSIS — R0602 Shortness of breath: Secondary | ICD-10-CM

## 2021-07-16 DIAGNOSIS — E119 Type 2 diabetes mellitus without complications: Secondary | ICD-10-CM

## 2021-07-16 DIAGNOSIS — E785 Hyperlipidemia, unspecified: Secondary | ICD-10-CM

## 2021-07-16 DIAGNOSIS — G4733 Obstructive sleep apnea (adult) (pediatric): Secondary | ICD-10-CM

## 2021-07-16 MED ORDER — AMLODIPINE BESYLATE 10 MG PO TABS
10.0000 mg | ORAL_TABLET | Freq: Every day | ORAL | 3 refills | Status: AC
Start: 2021-07-16 — End: ?

## 2021-07-16 MED ORDER — LOSARTAN POTASSIUM 100 MG PO TABS: 100.0000 mg | ORAL_TABLET | Freq: Every day | ORAL | 3 refills | Status: AC

## 2021-07-16 MED ORDER — SYNJARDY 5-500 MG PO TABS: 1.0000 | ORAL_TABLET | Freq: Two times a day (BID) | ORAL | 1 refills | Status: AC
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
## 2021-07-24 NOTE — Progress Notes (Signed)
Visit created in error.  See telephone encounter same date.

## 2021-08-13 ENCOUNTER — Encounter (HOSPITAL_BASED_OUTPATIENT_CLINIC_OR_DEPARTMENT_OTHER): Payer: Medicare Other | Admitting: Cardiology

## 2022-03-05 ENCOUNTER — Other Ambulatory Visit: Payer: Self-pay | Admitting: Cardiology

## 2022-03-05 DIAGNOSIS — E785 Hyperlipidemia, unspecified: Secondary | ICD-10-CM
